# Patient Record
Sex: Female | Born: 1985 | Hispanic: No | Marital: Single | Smoking: Former smoker
Health system: Southern US, Community
[De-identification: ages and names within clinical notes are randomized; demographics above are authoritative.]

## PROBLEM LIST (undated history)

## (undated) DIAGNOSIS — D649 Anemia, unspecified: Secondary | ICD-10-CM

## (undated) DIAGNOSIS — K2970 Gastritis, unspecified, without bleeding: Secondary | ICD-10-CM

## (undated) DIAGNOSIS — M778 Other enthesopathies, not elsewhere classified: Secondary | ICD-10-CM

## (undated) DIAGNOSIS — M7582 Other shoulder lesions, left shoulder: Secondary | ICD-10-CM

## (undated) DIAGNOSIS — M7581 Other shoulder lesions, right shoulder: Secondary | ICD-10-CM

---
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
## 2006-12-07 ENCOUNTER — Emergency Department (HOSPITAL_COMMUNITY): Admission: EM | Admit: 2006-12-07 | Discharge: 2006-12-07 | Payer: Self-pay | Admitting: Family Medicine

## 2007-04-19 ENCOUNTER — Emergency Department (HOSPITAL_COMMUNITY): Admission: EM | Admit: 2007-04-19 | Discharge: 2007-04-19 | Payer: Self-pay | Admitting: Family Medicine

## 2008-11-10 ENCOUNTER — Emergency Department (HOSPITAL_COMMUNITY): Admission: EM | Admit: 2008-11-10 | Discharge: 2008-11-10 | Payer: Self-pay | Admitting: Emergency Medicine

## 2009-04-17 IMAGING — CR DG ABDOMEN 2V
2 series · 2 of 2 positions shown · non-contrast
Comparison: None.

CLINICAL DATA: Severe right abdominal pain.

ABDOMEN - 2 VIEW

[w abdomen upright]
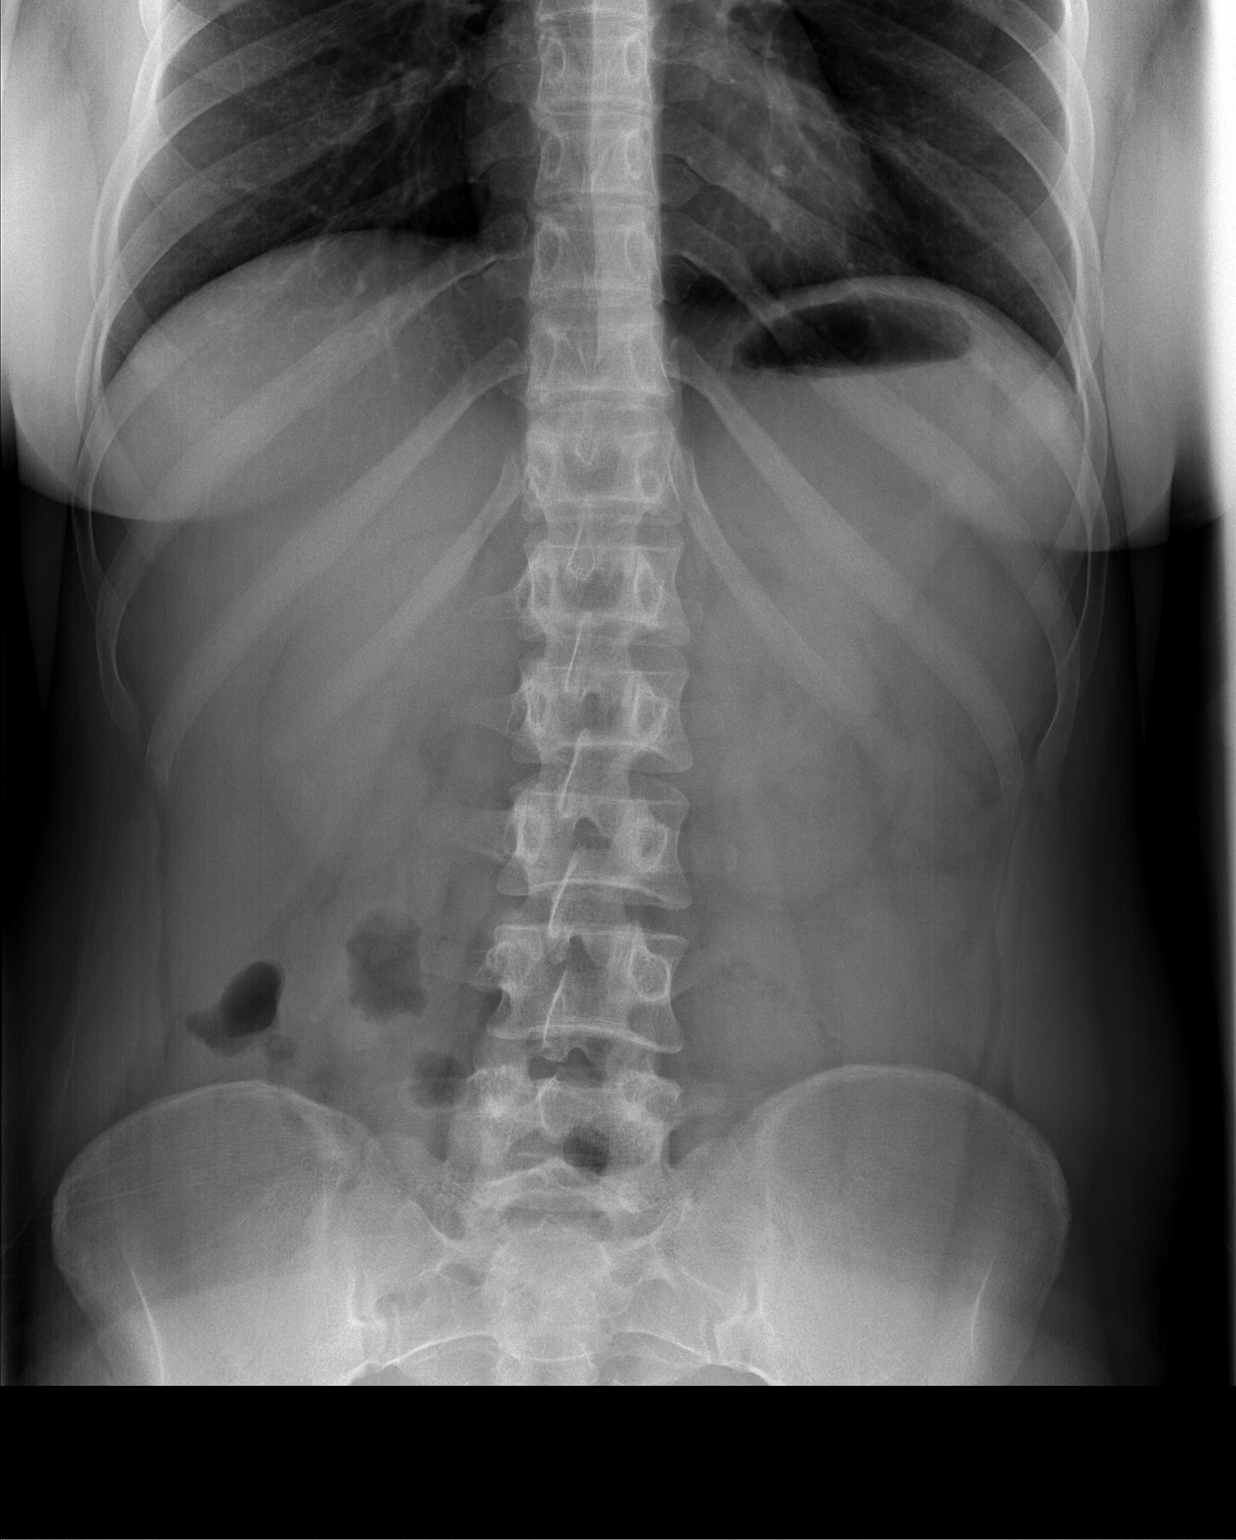

[t abdomen supine]
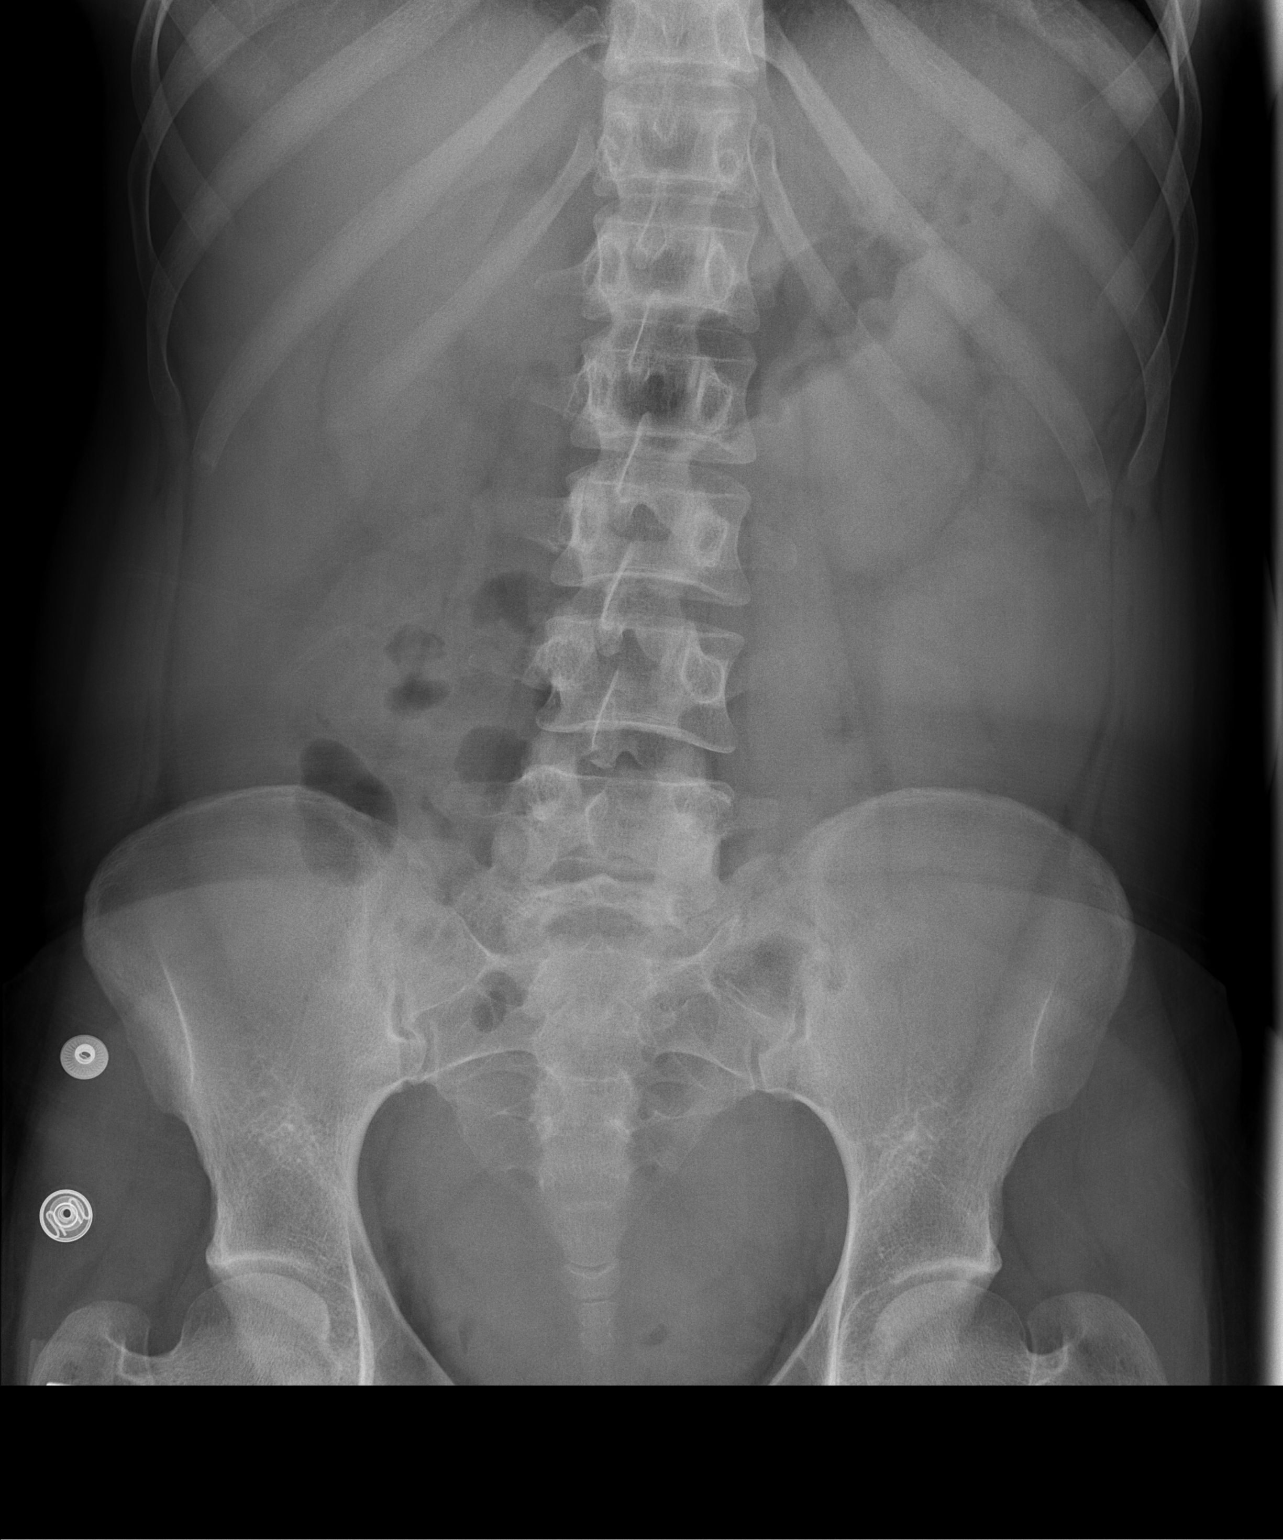

[2 of 2 positions shown; findings below may reference images not displayed]

FINDINGS: Normal bowel gas pattern without free peritoneal air.
Minimal scoliosis.
IMPRESSION: No acute abnormality.

## 2011-09-27 LAB — COMPREHENSIVE METABOLIC PANEL
AST: 24
Alkaline Phosphatase: 39
CO2: 18 — ABNORMAL LOW
Calcium: 8.3 — ABNORMAL LOW
Creatinine, Ser: 1.01
Glucose, Bld: 101 — ABNORMAL HIGH
Sodium: 137
Total Bilirubin: 0.8

## 2011-09-27 LAB — DIFFERENTIAL
Basophils Absolute: 0.1
Basophils Relative: 1
Eosinophils Relative: 0
Lymphocytes Relative: 11 — ABNORMAL LOW
Monocytes Absolute: 0.6
Monocytes Relative: 5
Neutro Abs: 10.3 — ABNORMAL HIGH

## 2011-09-27 LAB — CBC
HCT: 27.1 — ABNORMAL LOW
Hemoglobin: 9.4 — ABNORMAL LOW
MCHC: 34.6
MCV: 92.3
RBC: 2.93 — ABNORMAL LOW

## 2011-09-27 LAB — URINALYSIS, ROUTINE W REFLEX MICROSCOPIC
Leukocytes, UA: NEGATIVE
Nitrite: NEGATIVE
Protein, ur: NEGATIVE
Urobilinogen, UA: 0.2

## 2011-09-27 LAB — URINE MICROSCOPIC-ADD ON

## 2012-12-24 ENCOUNTER — Emergency Department (HOSPITAL_COMMUNITY)
Admission: EM | Admit: 2012-12-24 | Discharge: 2012-12-24 | Disposition: A | Discharge status: Home / Self Care | Payer: No Typology Code available for payment source | Attending: Emergency Medicine | Admitting: Emergency Medicine

## 2012-12-24 ENCOUNTER — Encounter (HOSPITAL_COMMUNITY): Payer: Self-pay

## 2012-12-24 ENCOUNTER — Emergency Department (HOSPITAL_COMMUNITY): Payer: No Typology Code available for payment source

## 2012-12-24 DIAGNOSIS — S0181XA Laceration without foreign body of other part of head, initial encounter: Secondary | ICD-10-CM

## 2012-12-24 DIAGNOSIS — IMO0002 Reserved for concepts with insufficient information to code with codable children: Secondary | ICD-10-CM

## 2012-12-24 DIAGNOSIS — Y92410 Unspecified street and highway as the place of occurrence of the external cause: Secondary | ICD-10-CM | POA: Insufficient documentation

## 2012-12-24 DIAGNOSIS — S0180XA Unspecified open wound of other part of head, initial encounter: Secondary | ICD-10-CM | POA: Insufficient documentation

## 2012-12-24 DIAGNOSIS — S0083XA Contusion of other part of head, initial encounter: Secondary | ICD-10-CM

## 2012-12-24 DIAGNOSIS — S0990XA Unspecified injury of head, initial encounter: Secondary | ICD-10-CM | POA: Insufficient documentation

## 2012-12-24 DIAGNOSIS — Y9389 Activity, other specified: Secondary | ICD-10-CM | POA: Insufficient documentation

## 2012-12-24 DIAGNOSIS — S1093XA Contusion of unspecified part of neck, initial encounter: Secondary | ICD-10-CM | POA: Insufficient documentation

## 2012-12-24 DIAGNOSIS — S0003XA Contusion of scalp, initial encounter: Secondary | ICD-10-CM | POA: Insufficient documentation

## 2012-12-24 MED ORDER — HALOPERIDOL LACTATE 5 MG/ML IJ SOLN
INTRAMUSCULAR | Status: AC
  Administered 2012-12-24: 5 mg via INTRAMUSCULAR
  Filled 2012-12-24: qty 1

## 2012-12-24 MED ORDER — HALOPERIDOL LACTATE 5 MG/ML IJ SOLN
5.0000 mg | Freq: Once | INTRAMUSCULAR | Status: AC
  Administered 2012-12-24: 5 mg via INTRAMUSCULAR

## 2012-12-24 NOTE — ED Notes (Signed)
Pt became very aggressive and uncooperative upon return to ED room. Freida Busman and [place], EDPs at bedside. Explained to pt that she was under IVC at this time for not cooperating. Pt cursing staff and refusing any explanations from this RN. Pt constantly taking BP cuff off after being instructed multiple times not too. Plan to perform head CT scan.

## 2012-12-24 NOTE — ED Provider Notes (Signed)
Patient signed out to me by Dr. Norlene Campbell. Patient was not cooperative and required Haldol for sedation. Her head CT was negative. She is neurologically stable at this time. Laceration was repaired by the physician assistant. Her father is here to take her home and  Toy Baker, MD 12/24/12 1005
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
## 2012-12-24 NOTE — ED Notes (Signed)
Pt arrived.  Not cooperating.  Refusing all care.  Dr. Norlene Campbell at bedside
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
## 2012-12-24 NOTE — ED Notes (Signed)
Dr. Norlene Campbell at bedside speaking to patient.  Dr. Norlene Campbell requesting CT scan and pt refusing.  Dr Norlene Campbell also notified patient of need for stitches to left eye laceration.  Wound cleaned with wash cloth by patient.  Pt refusing assist to clean wound.  Pressure held to stop bleeding by patient.  Bleeding controlled.

## 2012-12-24 NOTE — ED Notes (Signed)
IVC papers delivered to ED by [employer]. Freida Busman, EDP at bedside.

## 2012-12-24 NOTE — ED Notes (Signed)
Pt's mother and father arrived to ED. Pt given clothing back to change into. Leonel Ramsay and MD student made aware of their arrival. States he will rescinde pt's IVC paperwork and d/c.
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
## 2012-12-24 NOTE — ED Notes (Signed)
Writer asked pt for a set of VS, pt declined everything, no VS or urine sample from staff.
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
## 2012-12-24 NOTE — ED Provider Notes (Signed)
LACERATION REPAIR Performed by: Glade Nurse Authorized by: Glade Nurse Consent: Verbal consent obtained. Risks and benefits: risks, benefits and alternatives were discussed Consent given by: patient Patient identity confirmed: provided demographic data Prepped and Draped in normal sterile fashion Wound explored  Laceration Location: just below left lateral eyebrow  Laceration Length: 2.5 cm  No Foreign Bodies seen or palpated  Anesthesia: local infiltration  Local anesthetic: lidocaine 2%  With epinephrine  Anesthetic total: 2 ml  Irrigation method: syringe Amount of cleaning: standard  Skin closure: prolene 5-0  Number of sutures: 4  Technique: interrupted  Patient tolerance: Patient tolerated the procedure well with no immediate complications.  Glade Nurse, PA-C 12/24/12 1423

## 2012-12-24 NOTE — ED Notes (Signed)
Pt attempting to leave.  Pt deemed unsafe to leave by self.  Pt instructed that is she will call someone to take responsibility for her, she can leave.  Pt given access to phone.  Refusing to give number to staff,.  Numbers that were given do not work.  [employer] remains with staff.

## 2012-12-25 NOTE — ED Provider Notes (Signed)
Medical screening examination/treatment/procedure(s) were conducted as a shared visit with non-physician practitioner(s) and myself.  I personally evaluated the patient during the encounter  Toy Baker, MD 12/25/12 [phone]

## 2013-05-31 IMAGING — CT CT HEAD W/O CM
2 series · 17 of 30 positions shown, 20 images · non-contrast
Comparison: None

CLINICAL DATA: Pain.  Fall.  Hit head.

CT HEAD WITHOUT CONTRAST
TECHNIQUE: Contiguous axial images were obtained from the base of
the skull through the vertex without contrast.

[Series 2: head w/o · axial · non-contrast · 0.43mm/px · z∈[-138,-24]mm · 9 of 29 slices shown, 12 images]
[im 3/29  brain]
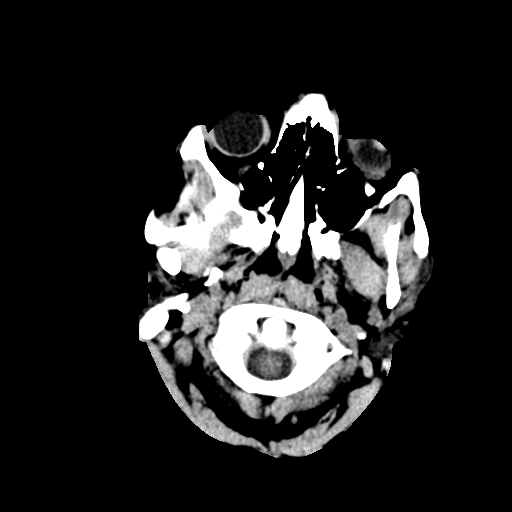
[im 3/29  bone]
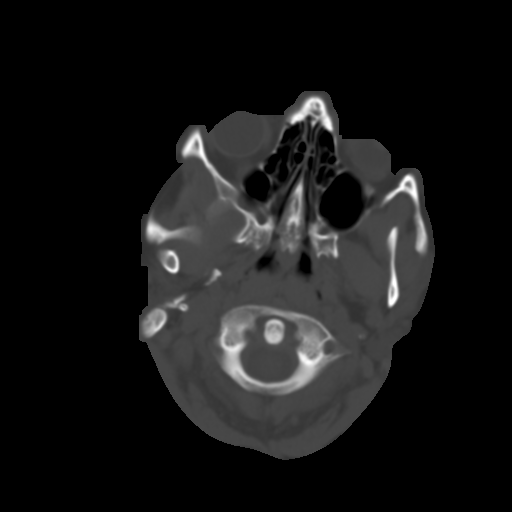
[im 6/29  brain]
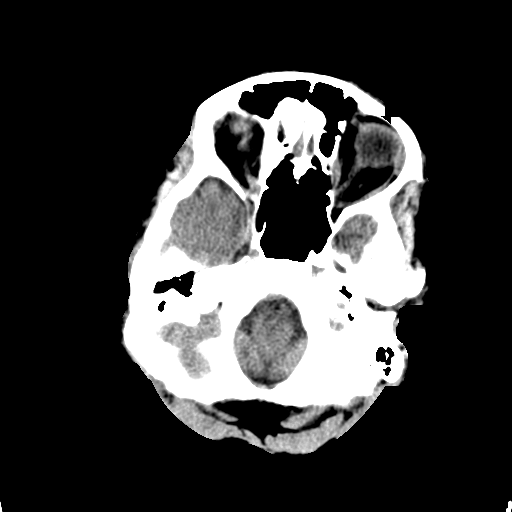
[im 9/29  brain]
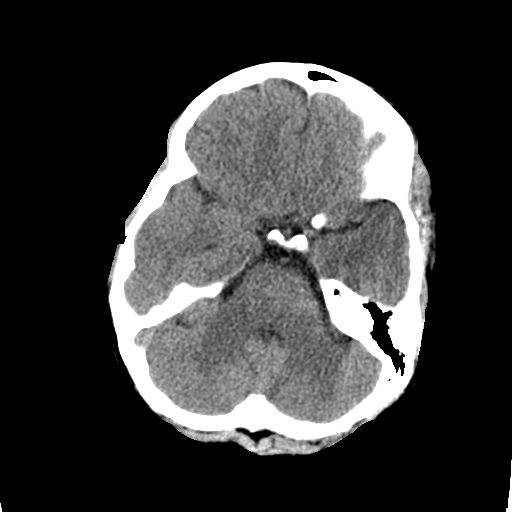
[im 12/29  brain]
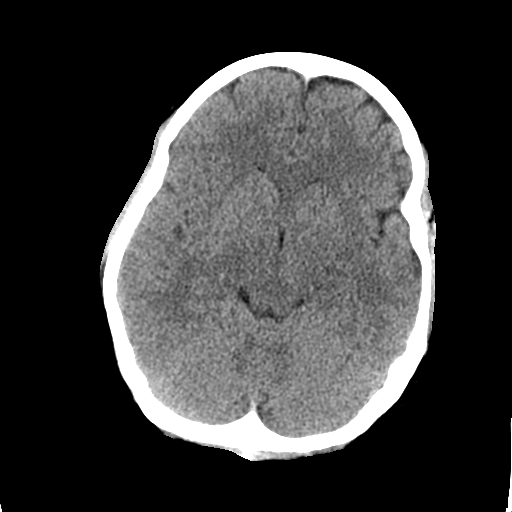
[im 15/29  brain]
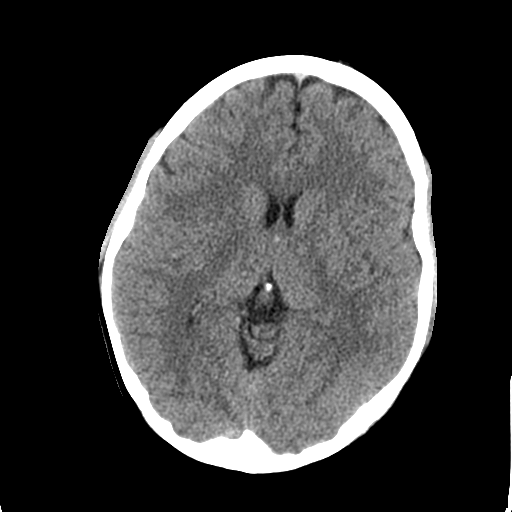
[im 15/29  bone]
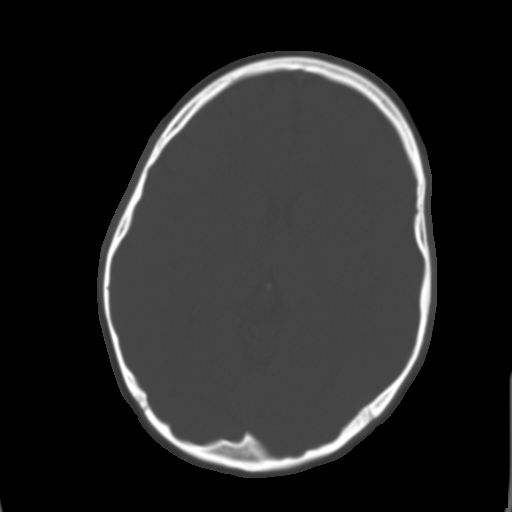
[im 17/29  brain]
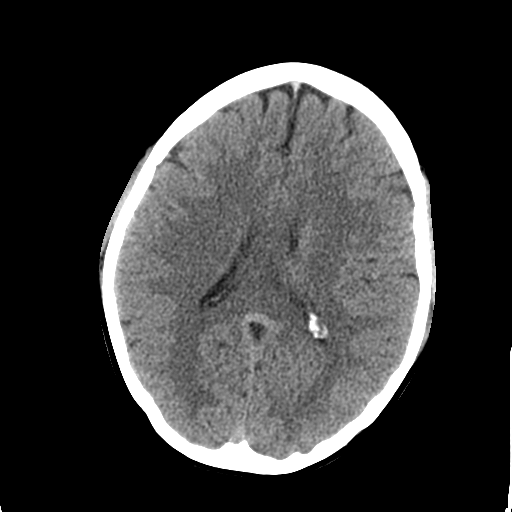
[im 20/29  brain]
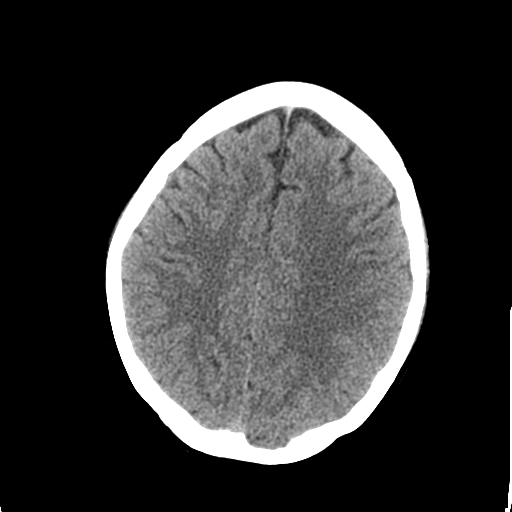
[im 23/29  brain]
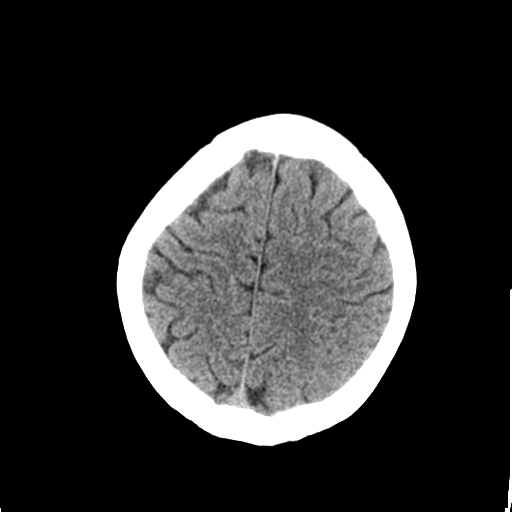
[im 26/29  brain]
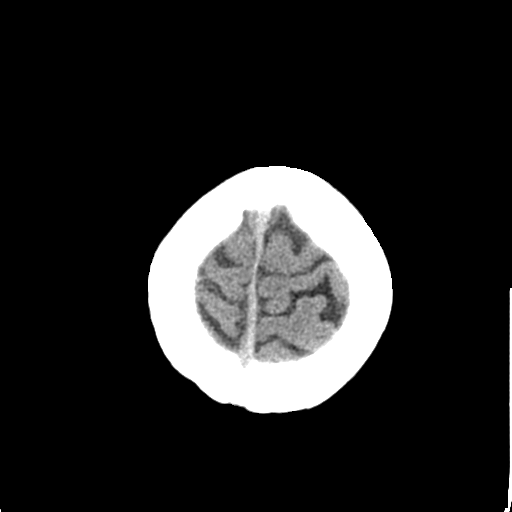
[im 26/29  bone]
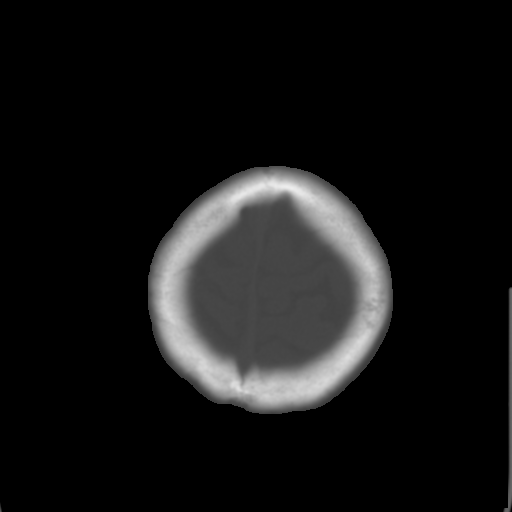

[Series 3: bone windows · axial · 0.43mm/px · z∈[-134,-26]mm · 8 of 48 slices shown]
[im 6/48  bone]
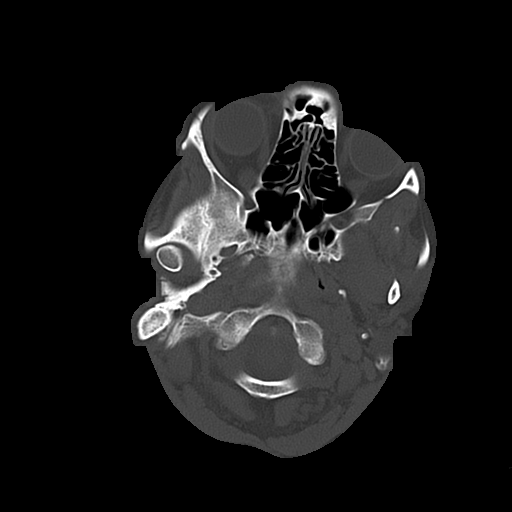
[im 11/48  bone]
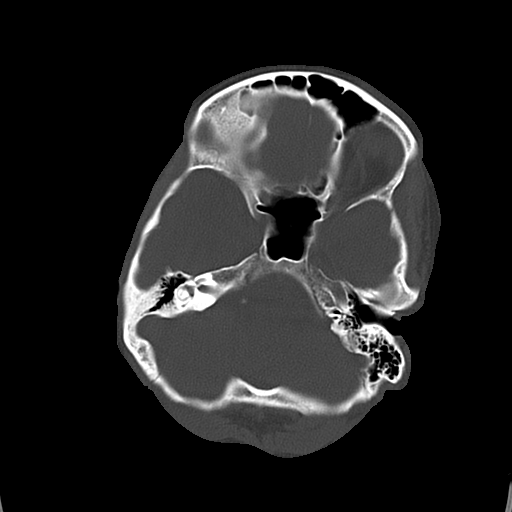
[im 16/48  bone]
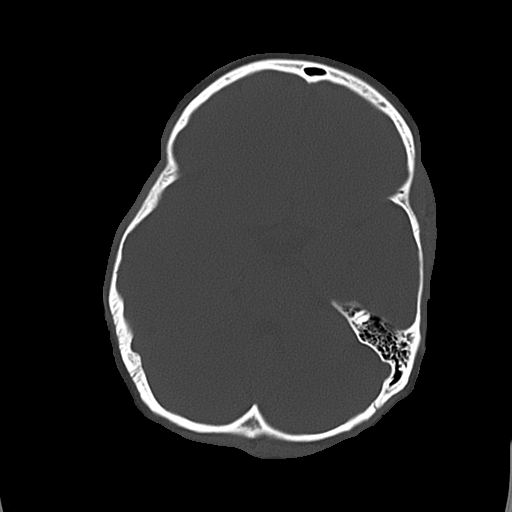
[im 21/48  bone]
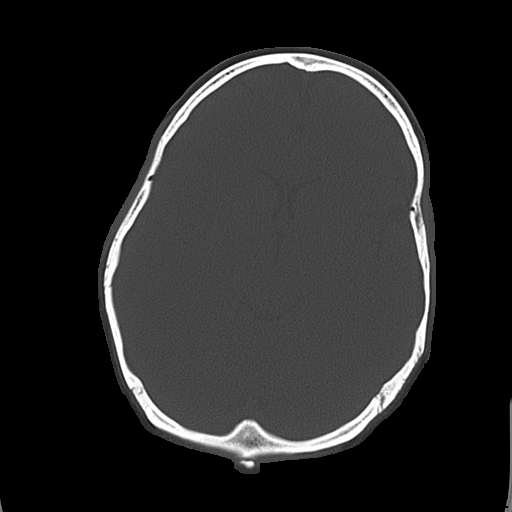
[im 27/48  bone]
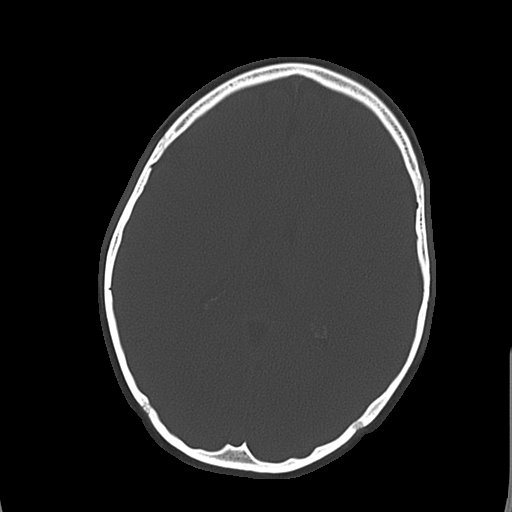
[im 32/48  bone]
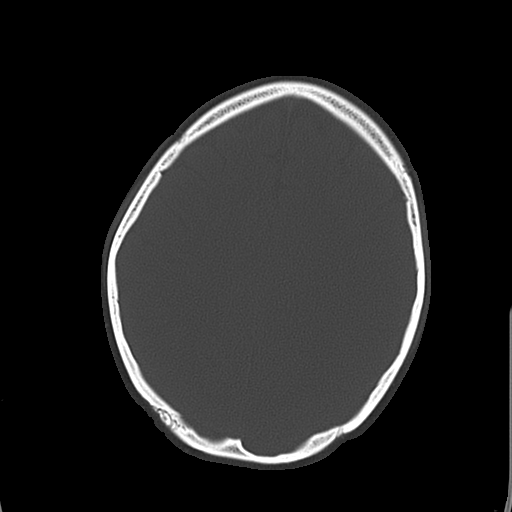
[im 37/48  bone]
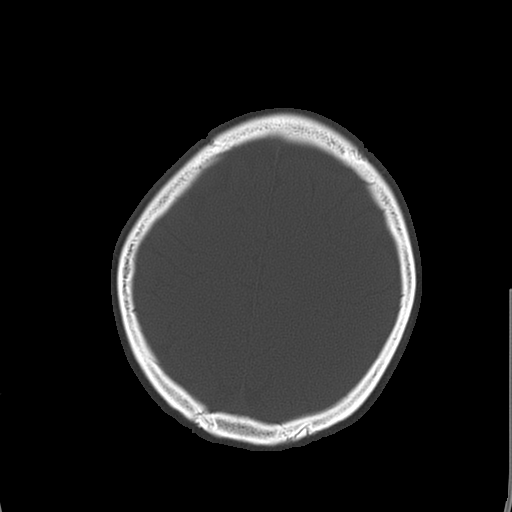
[im 42/48  bone]
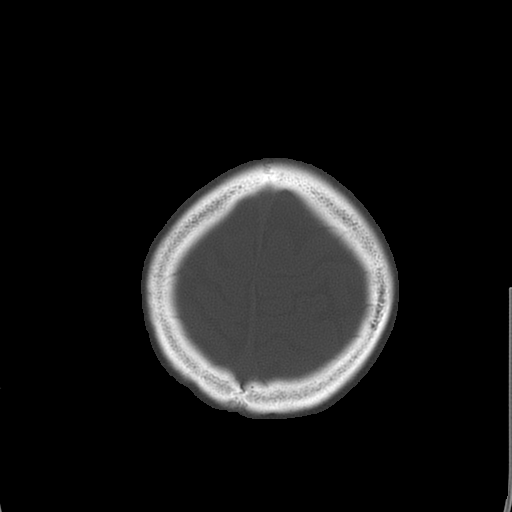

[17 of 30 positions shown; findings below may reference images not displayed]

FINDINGS: No acute intracranial abnormality.  Specifically, no
hemorrhage, hydrocephalus, mass lesion, acute infarction, or
significant intracranial injury.  No acute calvarial abnormality.
Visualized paranasal sinuses and mastoids clear.  Orbital soft
tissues unremarkable.
IMPRESSION: No intracranial abnormality.

## 2014-07-10 ENCOUNTER — Encounter (HOSPITAL_COMMUNITY): Payer: Self-pay | Admitting: Emergency Medicine

## 2014-07-10 ENCOUNTER — Emergency Department (INDEPENDENT_AMBULATORY_CARE_PROVIDER_SITE_OTHER)
Admission: EM | Admit: 2014-07-10 | Discharge: 2014-07-10 | Disposition: A | Discharge status: Home / Self Care | Payer: Self-pay | Source: Home / Self Care | Attending: Emergency Medicine | Admitting: Emergency Medicine

## 2014-07-10 DIAGNOSIS — M7582 Other shoulder lesions, left shoulder: Principal | ICD-10-CM

## 2014-07-10 DIAGNOSIS — M719 Bursopathy, unspecified: Secondary | ICD-10-CM

## 2014-07-10 DIAGNOSIS — M7581 Other shoulder lesions, right shoulder: Secondary | ICD-10-CM

## 2014-07-10 DIAGNOSIS — M67919 Unspecified disorder of synovium and tendon, unspecified shoulder: Secondary | ICD-10-CM

## 2014-07-10 MED ORDER — METHYLPREDNISOLONE ACETATE 40 MG/ML IJ SUSP
INTRAMUSCULAR | Status: AC
  Filled 2014-07-10: qty 5

## 2014-07-10 MED ORDER — HYDROCODONE-ACETAMINOPHEN 5-325 MG PO TABS: ORAL_TABLET | ORAL | Status: DC

## 2014-07-10 MED ORDER — METHYLPREDNISOLONE ACETATE PF 40 MG/ML IJ SUSP
40.0000 mg | Freq: Once | INTRAMUSCULAR | Status: AC
  Administered 2014-07-10: 40 mg via INTRA_ARTICULAR

## 2014-07-10 MED ORDER — MELOXICAM 15 MG PO TABS: 15.0000 mg | ORAL_TABLET | Freq: Every day | ORAL | Status: DC

## 2014-07-10 NOTE — ED Notes (Signed)
Ice pack x 2 to shoulders

## 2014-07-10 NOTE — ED Notes (Signed)
Reports bilateral shoulder pain sinc January 2015. No known injury, no relie w OTC pain medications. NAD

## 2014-07-10 NOTE — ED Provider Notes (Signed)
Chief Complaint   Chief Complaint  Patient presents with  . Shoulder Pain    History of Present Illness   Kathleen Spencer is a 28 year old female who's had a seven-month history of pain in both shoulders. She denies any injury. She works as a Naval architect. She does not do a lot of heavy lifting. The pain seems to alternate between one shoulder and the other. It hurts with any type of movements such as abduction or flexion. She denies any neck pain, chest pain, or upper back pain. There is no shortness of breath. The pain does not radiate down the arms there is no numbness, tingling, swelling, or weakness.  Review of Systems   Other than as noted above, the patient denies any of the following symptoms: Systemic:  No fevers or chills. Musculoskeletal:  No joint pain, arthritis, swelling, back pain, or neck pain. No history of arthritis.  Neurological:  No muscular weakness or paresthesia.  PMFSH   Past medical history, family history, social history, meds, and allergies were reviewed.    Physical Examination     Vital signs:  BP 100/74  Pulse 76  Temp(Src) 98.4 F (36.9 C) (Oral)  Resp 16  SpO2 99% Gen:  Alert and oriented times 3.  In no distress. Musculoskeletal: She has pain to palpation over both shoulders laterally and superiorly. Shoulders have a limited range of motion with pain. Neer test was positive.  Hawkins test was positive.  Empty cans test was positive. Speed's test was positive.  Yergason sign was negative.  Otherwise, all joints had a full a ROM with no swelling, bruising or deformity.  No edema, pulses full. Extremities were warm and pink.  Capillary refill was brisk.  Skin:  Clear, warm and dry.  No rash. Neuro:  Alert and oriented times 3.  Muscle strength was normal.  Sensation was intact to light touch.   Procedure Note:  Verbal informed consent was obtained from the patient.  Risks and benefits were outlined with the patient.  Patient understands and  accepts these risks. A time out was called and the name of the procedure, the procedure site, and identity of the patient were confirmed verbally and by wristband.    The procedure was then performed as follows:  Posterior aspect of the left shoulder was prepped with Betadine and alcohol and anesthetized with ethyl chloride spray. Using a one half inch 27-gauge needle, 2 cc of 2% Xylocaine and 1 cc of Depo-Medrol 40 mg strength were injected into the subacromial space. The patient tolerated this procedure well. A Band-Aid was applied.  The patient tolerated the procedure well without any immediate complications.     Assessment   The encounter diagnosis was Tendonitis of both rotator cuffs.  Plan     1.  Meds:  The following meds were prescribed:   Discharge Medication List as of 07/10/2014  5:45 PM    START taking these medications   Details  HYDROcodone-acetaminophen (NORCO/VICODIN) 5-325 MG per tablet 1 to 2 tabs every 4 to 6 hours as needed for pain., Print    meloxicam (MOBIC) 15 MG tablet Take 1 tablet (15 mg total) by mouth daily., Starting 07/10/2014, Until Discontinued, Normal        2.  Patient Education/Counseling:  The patient was given appropriate handouts, self care instructions, and instructed in symptomatic relief.  Suggested resting, icing for the first 3 days, followed by exercises. It's the other shoulder is still symptomatic, may return again in  about a week for injection of the other shoulder.  3.  Follow up:  The patient was told to follow up here if no better in 3 to 4 days, or sooner if becoming worse in any way, and given some red flag symptoms such as worsening pain or new neurological symptoms which would prompt immediate return.  Followup with orthopedics if no better in one to 2 weeks.     Reuben Likesavid C Suzannah Bettes, MD 07/10/14 2127

## 2014-07-10 NOTE — Discharge Instructions (Signed)

## 2014-07-17 ENCOUNTER — Emergency Department (INDEPENDENT_AMBULATORY_CARE_PROVIDER_SITE_OTHER)
Admission: EM | Admit: 2014-07-17 | Discharge: 2014-07-17 | Disposition: A | Discharge status: Home / Self Care | Payer: Self-pay | Source: Home / Self Care | Attending: Family Medicine | Admitting: Family Medicine

## 2014-07-17 ENCOUNTER — Encounter (HOSPITAL_COMMUNITY): Payer: Self-pay | Admitting: Emergency Medicine

## 2014-07-17 DIAGNOSIS — IMO0002 Reserved for concepts with insufficient information to code with codable children: Secondary | ICD-10-CM

## 2014-07-17 DIAGNOSIS — M75100 Unspecified rotator cuff tear or rupture of unspecified shoulder, not specified as traumatic: Secondary | ICD-10-CM

## 2014-07-17 DIAGNOSIS — M7551 Bursitis of right shoulder: Secondary | ICD-10-CM

## 2014-07-17 MED ORDER — METHYLPREDNISOLONE ACETATE 40 MG/ML IJ SUSP
40.0000 mg | Freq: Once | INTRAMUSCULAR | Status: AC
  Administered 2014-07-17: 40 mg via INTRA_ARTICULAR

## 2014-07-17 MED ORDER — METHYLPREDNISOLONE ACETATE 40 MG/ML IJ SUSP
INTRAMUSCULAR | Status: AC
  Filled 2014-07-17: qty 5

## 2014-07-17 NOTE — ED Provider Notes (Signed)
Kathleen CanalesHaley N Spencer is a 28 y.o. female who presents to Urgent Care today for right shoulder pain. Patient was seen on July 17 for bilateral shoulder pain found to be subacromial bursitis bilaterally. She received a left-sided corticosteroid injection and felt much better. She's here today for right-sided corticosteroid injection. She has pain is worse with overhand motion reaching back and at night. Pain is been present for months. Over-the-counter and oral medications have not been very effective. She denies any neck pain radiating pain weakness or numbness.   History reviewed. No pertinent past medical history. History  Substance Use Topics  . Smoking status: Current Every Day Smoker  . Smokeless tobacco: Not on file  . Alcohol Use: Yes   ROS as above Medications: No current facility-administered medications for this encounter.   Current Outpatient Prescriptions  Medication Sig Dispense Refill  . HYDROcodone-acetaminophen (NORCO/VICODIN) 5-325 MG per tablet 1 to 2 tabs every 4 to 6 hours as needed for pain.  20 tablet  0  . meloxicam (MOBIC) 15 MG tablet Take 1 tablet (15 mg total) by mouth daily.  30 tablet  0    Exam:  BP 103/76  Pulse 104  Temp(Src) 98.1 F (36.7 C) (Oral)  Resp 16  Ht 5\' 4"  (1.626 m)  Wt 125 lb (56.7 kg)  BMI 21.45 kg/m2  SpO2 97% Gen: Well NAD Right shoulder: Normal-appearing nontender Range of motion limited in abduction by pain. Positive impingement testing strength is intact throughout Capillary refill, pulses, and sensation intact distally  Subacromial Injection: Right  Consent obtained and time out performed.  Area cleaned with alcohol.  40Mg  of depomedrol and 3ml of 0.5% marcaine was injected into the subacromial bursa without complication or bleeding. Patient tolerated the procedure well.     No results found for this or any previous visit (from the past 24 hour(s)). No results found.  Assessment and Plan: 28 y.o. female with right-sided  subacromial bursitis. Plan for home exercise program and corticosteroid injection. Meloxicam as needed.  Discussed warning signs or symptoms. Please see discharge instructions. Patient expresses understanding.   This note was created using Conservation officer, historic buildingsDragon voice recognition software. Any transcription errors are unintended.    Rodolph BongEvan S Lama Narayanan, MD 07/17/14 [phone]

## 2014-07-17 NOTE — Discharge Instructions (Signed)
Thank you for coming in today. 'Do the exercises we talked about.  Call or go to the ER if you develop a large red swollen joint with extreme pain or oozing puss.   Impingement Syndrome, Rotator Cuff, Bursitis with Rehab Impingement syndrome is a condition that involves inflammation of the tendons of the rotator cuff and the subacromial bursa, that causes pain in the shoulder. The rotator cuff consists of four tendons and muscles that control much of the shoulder and upper arm function. The subacromial bursa is a fluid filled sac that helps reduce friction between the rotator cuff and one of the bones of the shoulder (acromion). Impingement syndrome is usually an overuse injury that causes swelling of the bursa (bursitis), swelling of the tendon (tendonitis), and/or a tear of the tendon (strain). Strains are classified into three categories. Grade 1 strains cause pain, but the tendon is not lengthened. Grade 2 strains include a lengthened ligament, due to the ligament being stretched or partially ruptured. With grade 2 strains there is still function, although the function may be decreased. Grade 3 strains include a complete tear of the tendon or muscle, and function is usually impaired. SYMPTOMS   Pain around the shoulder, often at the outer portion of the upper arm.  Pain that gets worse with shoulder function, especially when reaching overhead or lifting.  Sometimes, aching when not using the arm.  Pain that wakes you up at night.  Sometimes, tenderness, swelling, warmth, or redness over the affected area.  Loss of strength.  Limited motion of the shoulder, especially reaching behind the back (to the back pocket or to unhook bra) or across your body.  Crackling sound (crepitation) when moving the arm.  Biceps tendon pain and inflammation (in the front of the shoulder). Worse when bending the elbow or lifting. CAUSES  Impingement syndrome is often an overuse injury, in which chronic  (repetitive) motions cause the tendons or bursa to become inflamed. A strain occurs when a force is paced on the tendon or muscle that is greater than it can withstand. Common mechanisms of injury include: Stress from sudden increase in duration, frequency, or intensity of training.  Direct hit (trauma) to the shoulder.  Aging, erosion of the tendon with normal use.  Bony bump on shoulder (acromial spur). RISK INCREASES WITH:  Contact sports (football, wrestling, boxing).  Throwing sports (baseball, tennis, volleyball).  Weightlifting and bodybuilding.  Heavy labor.  Previous injury to the rotator cuff, including impingement.  Poor shoulder strength and flexibility.  Failure to warm up properly before activity.  Inadequate protective equipment.  Old age.  Bony bump on shoulder (acromial spur). PREVENTION   Warm up and stretch properly before activity.  Allow for adequate recovery between workouts.  Maintain physical fitness:  Strength, flexibility, and endurance.  Cardiovascular fitness.  Learn and use proper exercise technique. PROGNOSIS  If treated properly, impingement syndrome usually goes away within 6 weeks. Sometimes surgery is required.  RELATED COMPLICATIONS   Longer healing time if not properly treated, or if not given enough time to heal.  Recurring symptoms, that result in a chronic condition.  Shoulder stiffness, frozen shoulder, or loss of motion.  Rotator cuff tendon tear.  Recurring symptoms, especially if activity is resumed too soon, with overuse, with a direct blow, or when using poor technique. TREATMENT  Treatment first involves the use of ice and medicine, to reduce pain and inflammation. The use of strengthening and stretching exercises may help reduce pain with activity. These  exercises may be performed at home or with a therapist. If non-surgical treatment is unsuccessful after more than 6 months, surgery may be advised. After surgery  and rehabilitation, activity is usually possible in 3 months.  MEDICATION  If pain medicine is needed, nonsteroidal anti-inflammatory medicines (aspirin and ibuprofen), or other minor pain relievers (acetaminophen), are often advised.  Do not take pain medicine for 7 days before surgery.  Prescription pain relievers may be given, if your caregiver thinks they are needed. Use only as directed and only as much as you need.  Corticosteroid injections may be given by your caregiver. These injections should be reserved for the most serious cases, because they may only be given a certain number of times. HEAT AND COLD  Cold treatment (icing) should be applied for 10 to 15 minutes every 2 to 3 hours for inflammation and pain, and immediately after activity that aggravates your symptoms. Use ice packs or an ice massage.  Heat treatment may be used before performing stretching and strengthening activities prescribed by your caregiver, physical therapist, or athletic trainer. Use a heat pack or a warm water soak. SEEK MEDICAL CARE IF:   Symptoms get worse or do not improve in 4 to 6 weeks, despite treatment.  New, unexplained symptoms develop. (Drugs used in treatment may produce side effects.) EXERCISES  RANGE OF MOTION (ROM) AND STRETCHING EXERCISES - Impingement Syndrome (Rotator Cuff  Tendinitis, Bursitis) These exercises may help you when beginning to rehabilitate your injury. Your symptoms may go away with or without further involvement from your physician, physical therapist or athletic trainer. While completing these exercises, remember:   Restoring tissue flexibility helps normal motion to return to the joints. This allows healthier, less painful movement and activity.  An effective stretch should be held for at least 30 seconds.  A stretch should never be painful. You should only feel a gentle lengthening or release in the stretched tissue. STRETCH - Flexion, Standing  Stand with good  posture. With an underhand grip on your right / left hand, and an overhand grip on the opposite hand, grasp a broomstick or cane so that your hands are a little more than shoulder width apart.  Keeping your right / left elbow straight and shoulder muscles relaxed, push the stick with your opposite hand, to raise your right / left arm in front of your body and then overhead. Raise your arm until you feel a stretch in your right / left shoulder, but before you have increased shoulder pain.  Try to avoid shrugging your right / left shoulder as your arm rises, by keeping your shoulder blade tucked down and toward your mid-back spine. Hold for __________ seconds.  Slowly return to the starting position. Repeat __________ times. Complete this exercise __________ times per day. STRETCH - Abduction, Supine  Lie on your back. With an underhand grip on your right / left hand and an overhand grip on the opposite hand, grasp a broomstick or cane so that your hands are a little more than shoulder width apart.  Keeping your right / left elbow straight and your shoulder muscles relaxed, push the stick with your opposite hand, to raise your right / left arm out to the side of your body and then overhead. Raise your arm until you feel a stretch in your right / left shoulder, but before you have increased shoulder pain.  Try to avoid shrugging your right / left shoulder as your arm rises, by keeping your shoulder blade tucked  down and toward your mid-back spine. Hold for __________ seconds.  Slowly return to the starting position. Repeat __________ times. Complete this exercise __________ times per day. ROM - Flexion, Active-Assisted  Lie on your back. You may bend your knees for comfort.  Grasp a broomstick or cane so your hands are about shoulder width apart. Your right / left hand should grip the end of the stick, so that your hand is positioned "thumbs-up," as if you were about to shake hands.  Using your  healthy arm to lead, raise your right / left arm overhead, until you feel a gentle stretch in your shoulder. Hold for __________ seconds.  Use the stick to assist in returning your right / left arm to its starting position. Repeat __________ times. Complete this exercise __________ times per day.  ROM - Internal Rotation, Supine   Lie on your back on a firm surface. Place your right / left elbow about 60 degrees away from your side. Elevate your elbow with a folded towel, so that the elbow and shoulder are the same height.  Using a broomstick or cane and your strong arm, pull your right / left hand toward your body until you feel a gentle stretch, but no increase in your shoulder pain. Keep your shoulder and elbow in place throughout the exercise.  Hold for __________ seconds. Slowly return to the starting position. Repeat __________ times. Complete this exercise __________ times per day. STRETCH - Internal Rotation  Place your right / left hand behind your back, palm up.  Throw a towel or belt over your opposite shoulder. Grasp the towel with your right / left hand.  While keeping an upright posture, gently pull up on the towel, until you feel a stretch in the front of your right / left shoulder.  Avoid shrugging your right / left shoulder as your arm rises, by keeping your shoulder blade tucked down and toward your mid-back spine.  Hold for __________ seconds. Release the stretch, by lowering your healthy hand. Repeat __________ times. Complete this exercise __________ times per day. ROM - Internal Rotation   Using an underhand grip, grasp a stick behind your back with both hands.  While standing upright with good posture, slide the stick up your back until you feel a mild stretch in the front of your shoulder.  Hold for __________ seconds. Slowly return to your starting position. Repeat __________ times. Complete this exercise __________ times per day.  STRETCH - Posterior Shoulder  Capsule   Stand or sit with good posture. Grasp your right / left elbow and draw it across your chest, keeping it at the same height as your shoulder.  Pull your elbow, so your upper arm comes in closer to your chest. Pull until you feel a gentle stretch in the back of your shoulder.  Hold for __________ seconds. Repeat __________ times. Complete this exercise __________ times per day. STRENGTHENING EXERCISES - Impingement Syndrome (Rotator Cuff Tendinitis, Bursitis) These exercises may help you when beginning to rehabilitate your injury. They may resolve your symptoms with or without further involvement from your physician, physical therapist or athletic trainer. While completing these exercises, remember:  Muscles can gain both the endurance and the strength needed for everyday activities through controlled exercises.  Complete these exercises as instructed by your physician, physical therapist or athletic trainer. Increase the resistance and repetitions only as guided.  You may experience muscle soreness or fatigue, but the pain or discomfort you are trying to eliminate should  never worsen during these exercises. If this pain does get worse, stop and make sure you are following the directions exactly. If the pain is still present after adjustments, discontinue the exercise until you can discuss the trouble with your clinician.  During your recovery, avoid activity or exercises which involve actions that place your injured hand or elbow above your head or behind your back or head. These positions stress the tissues which you are trying to heal. STRENGTH - Scapular Depression and Adduction   With good posture, sit on a firm chair. Support your arms in front of you, with pillows, arm rests, or on a table top. Have your elbows in line with the sides of your body.  Gently draw your shoulder blades down and toward your mid-back spine. Gradually increase the tension, without tensing the muscles  along the top of your shoulders and the back of your neck.  Hold for __________ seconds. Slowly release the tension and relax your muscles completely before starting the next repetition.  After you have practiced this exercise, remove the arm support and complete the exercise in standing as well as sitting position. Repeat __________ times. Complete this exercise __________ times per day.  STRENGTH - Shoulder Abductors, Isometric  With good posture, stand or sit about 4-6 inches from a wall, with your right / left side facing the wall.  Bend your right / left elbow. Gently press your right / left elbow into the wall. Increase the pressure gradually, until you are pressing as hard as you can, without shrugging your shoulder or increasing any shoulder discomfort.  Hold for __________ seconds.  Release the tension slowly. Relax your shoulder muscles completely before you begin the next repetition. Repeat __________ times. Complete this exercise __________ times per day.  STRENGTH - External Rotators, Isometric  Keep your right / left elbow at your side and bend it 90 degrees.  Step into a door frame so that the outside of your right / left wrist can press against the door frame without your upper arm leaving your side.  Gently press your right / left wrist into the door frame, as if you were trying to swing the back of your hand away from your stomach. Gradually increase the tension, until you are pressing as hard as you can, without shrugging your shoulder or increasing any shoulder discomfort.  Hold for __________ seconds.  Release the tension slowly. Relax your shoulder muscles completely before you begin the next repetition. Repeat __________ times. Complete this exercise __________ times per day.  STRENGTH - Supraspinatus   Stand or sit with good posture. Grasp a __________ weight, or an exercise band or tubing, so that your hand is "thumbs-up," like you are shaking hands.  Slowly  lift your right / left arm in a "V" away from your thigh, diagonally into the space between your side and straight ahead. Lift your hand to shoulder height or as far as you can, without increasing any shoulder pain. At first, many people do not lift their hands above shoulder height.  Avoid shrugging your right / left shoulder as your arm rises, by keeping your shoulder blade tucked down and toward your mid-back spine.  Hold for __________ seconds. Control the descent of your hand, as you slowly return to your starting position. Repeat __________ times. Complete this exercise __________ times per day.  STRENGTH - External Rotators  Secure a rubber exercise band or tubing to a fixed object (table, pole) so that it is at  the same height as your right / left elbow when you are standing or sitting on a firm surface.  Stand or sit so that the secured exercise band is at your uninjured side.  Bend your right / left elbow 90 degrees. Place a folded towel or small pillow under your right / left arm, so that your elbow is a few inches away from your side.  Keeping the tension on the exercise band, pull it away from your body, as if pivoting on your elbow. Be sure to keep your body steady, so that the movement is coming only from your rotating shoulder.  Hold for __________ seconds. Release the tension in a controlled manner, as you return to the starting position. Repeat __________ times. Complete this exercise __________ times per day.  STRENGTH - Internal Rotators   Secure a rubber exercise band or tubing to a fixed object (table, pole) so that it is at the same height as your right / left elbow when you are standing or sitting on a firm surface.  Stand or sit so that the secured exercise band is at your right / left side.  Bend your elbow 90 degrees. Place a folded towel or small pillow under your right / left arm so that your elbow is a few inches away from your side.  Keeping the tension on the  exercise band, pull it across your body, toward your stomach. Be sure to keep your body steady, so that the movement is coming only from your rotating shoulder.  Hold for __________ seconds. Release the tension in a controlled manner, as you return to the starting position. Repeat __________ times. Complete this exercise __________ times per day.  STRENGTH - Scapular Protractors, Standing   Stand arms length away from a wall. Place your hands on the wall, keeping your elbows straight.  Begin by dropping your shoulder blades down and toward your mid-back spine.  To strengthen your protractors, keep your shoulder blades down, but slide them forward on your rib cage. It will feel as if you are lifting the back of your rib cage away from the wall. This is a subtle motion and can be challenging to complete. Ask your caregiver for further instruction, if you are not sure you are doing the exercise correctly.  Hold for __________ seconds. Slowly return to the starting position, resting the muscles completely before starting the next repetition. Repeat __________ times. Complete this exercise __________ times per day. STRENGTH - Scapular Protractors, Supine  Lie on your back on a firm surface. Extend your right / left arm straight into the air while holding a __________ weight in your hand.  Keeping your head and back in place, lift your shoulder off the floor.  Hold for __________ seconds. Slowly return to the starting position, and allow your muscles to relax completely before starting the next repetition. Repeat __________ times. Complete this exercise __________ times per day. STRENGTH - Scapular Protractors, Quadruped  Get onto your hands and knees, with your shoulders directly over your hands (or as close as you can be, comfortably).  Keeping your elbows locked, lift the back of your rib cage up into your shoulder blades, so your mid-back rounds out. Keep your neck muscles relaxed.  Hold  this position for __________ seconds. Slowly return to the starting position and allow your muscles to relax completely before starting the next repetition. Repeat __________ times. Complete this exercise __________ times per day.  STRENGTH - Scapular Retractors  Secure a  rubber exercise band or tubing to a fixed object (table, pole), so that it is at the height of your shoulders when you are either standing, or sitting on a firm armless chair.  With a palm down grip, grasp an end of the band in each hand. Straighten your elbows and lift your hands straight in front of you, at shoulder height. Step back, away from the secured end of the band, until it becomes tense.  Squeezing your shoulder blades together, draw your elbows back toward your sides, as you bend them. Keep your upper arms lifted away from your body throughout the exercise.  Hold for __________ seconds. Slowly ease the tension on the band, as you reverse the directions and return to the starting position. Repeat __________ times. Complete this exercise __________ times per day. STRENGTH - Shoulder Extensors   Secure a rubber exercise band or tubing to a fixed object (table, pole) so that it is at the height of your shoulders when you are either standing, or sitting on a firm armless chair.  With a thumbs-up grip, grasp an end of the band in each hand. Straighten your elbows and lift your hands straight in front of you, at shoulder height. Step back, away from the secured end of the band, until it becomes tense.  Squeezing your shoulder blades together, pull your hands down to the sides of your thighs. Do not allow your hands to go behind you.  Hold for __________ seconds. Slowly ease the tension on the band, as you reverse the directions and return to the starting position. Repeat __________ times. Complete this exercise __________ times per day.  STRENGTH - Scapular Retractors and External Rotators   Secure a rubber exercise  band or tubing to a fixed object (table, pole) so that it is at the height as your shoulders, when you are either standing, or sitting on a firm armless chair.  With a palm down grip, grasp an end of the band in each hand. Bend your elbows 90 degrees and lift your elbows to shoulder height, at your sides. Step back, away from the secured end of the band, until it becomes tense.  Squeezing your shoulder blades together, rotate your shoulders so that your upper arms and elbows remain stationary, but your fists travel upward to head height.  Hold for __________ seconds. Slowly ease the tension on the band, as you reverse the directions and return to the starting position. Repeat __________ times. Complete this exercise __________ times per day.  STRENGTH - Scapular Retractors and External Rotators, Rowing   Secure a rubber exercise band or tubing to a fixed object (table, pole) so that it is at the height of your shoulders, when you are either standing, or sitting on a firm armless chair.  With a palm down grip, grasp an end of the band in each hand. Straighten your elbows and lift your hands straight in front of you, at shoulder height. Step back, away from the secured end of the band, until it becomes tense.  Step 1: Squeeze your shoulder blades together. Bending your elbows, draw your hands to your chest, as if you are rowing a boat. At the end of this motion, your hands and elbow should be at shoulder height and your elbows should be out to your sides.  Step 2: Rotate your shoulders, to raise your hands above your head. Your forearms should be vertical and your upper arms should be horizontal.  Hold for __________ seconds. Slowly ease  the tension on the band, as you reverse the directions and return to the starting position. Repeat __________ times. Complete this exercise __________ times per day.  STRENGTH - Scapular Depressors  Find a sturdy chair without wheels, such as a dining room  chair.  Keeping your feet on the floor, and your hands on the chair arms, lift your bottom up from the seat, and lock your elbows.  Keeping your elbows straight, allow gravity to pull your body weight down. Your shoulders will rise toward your ears.  Raise your body against gravity by drawing your shoulder blades down your back, shortening the distance between your shoulders and ears. Although your feet should always maintain contact with the floor, your feet should progressively support less body weight, as you get stronger.  Hold for __________ seconds. In a controlled and slow manner, lower your body weight to begin the next repetition. Repeat __________ times. Complete this exercise __________ times per day.  Document Released: 12/11/2005 Document Revised: 03/04/2012 Document Reviewed: 03/25/2009 [facility] Patient Information 2015 [place], [place]. This information is not intended to replace advice given to you by your health care provider. Make sure you discuss any questions you have with your health care provider.

## 2014-07-17 NOTE — ED Notes (Signed)
C/o right shoulder pain States she was seen by dr Lorenz Coasterkeller the last time she was here. She received a steriod shot on left shoulder and now she would like her right shoulder injected

## 2015-10-07 ENCOUNTER — Encounter (HOSPITAL_COMMUNITY): Payer: Self-pay | Admitting: *Deleted

## 2015-10-07 ENCOUNTER — Emergency Department (INDEPENDENT_AMBULATORY_CARE_PROVIDER_SITE_OTHER)
Admission: EM | Admit: 2015-10-07 | Discharge: 2015-10-07 | Disposition: A | Discharge status: Home / Self Care | Payer: Self-pay | Source: Home / Self Care

## 2015-10-07 DIAGNOSIS — M779 Enthesopathy, unspecified: Secondary | ICD-10-CM

## 2015-10-07 DIAGNOSIS — K2970 Gastritis, unspecified, without bleeding: Secondary | ICD-10-CM

## 2015-10-07 HISTORY — DX: Other enthesopathies, not elsewhere classified: M77.8

## 2015-10-07 HISTORY — DX: Other shoulder lesions, right shoulder: M75.81

## 2015-10-07 HISTORY — DX: Gastritis, unspecified, without bleeding: K29.70

## 2015-10-07 HISTORY — DX: Other shoulder lesions, left shoulder: M75.82

## 2015-10-07 MED ORDER — SUCRALFATE 1 G PO TABS: 1.0000 g | ORAL_TABLET | Freq: Three times a day (TID) | ORAL | Status: DC

## 2015-10-07 MED ORDER — ZOLPIDEM TARTRATE 5 MG PO TABS: 5.0000 mg | ORAL_TABLET | Freq: Every evening | ORAL | Status: DC | PRN

## 2015-10-07 MED ORDER — OMEPRAZOLE 20 MG PO CPDR: 20.0000 mg | DELAYED_RELEASE_CAPSULE | Freq: Every day | ORAL | Status: DC

## 2015-10-07 MED ORDER — MIDAZOLAM HCL 2 MG/2ML IJ SOLN
INTRAMUSCULAR | Status: AC
  Filled 2015-10-07: qty 4

## 2015-10-07 MED ORDER — FENTANYL CITRATE (PF) 100 MCG/2ML IJ SOLN
INTRAMUSCULAR | Status: AC
  Filled 2015-10-07: qty 4

## 2015-10-07 NOTE — ED Provider Notes (Signed)
CSN: [id number]     Arrival date & time 10/07/15  1326 History   None    Chief Complaint  Patient presents with  . Abdominal Pain   (Consider location/radiation/quality/duration/timing/severity/associated sxs/prior Treatment) HPI Comments: 29 year old Caucasian female presents to urgent care complaining of abdominal pain and right shoulder pain. Both are chronic complaints of worsened in severity over the last few weeks to months. She has been diagnosed with gastritis in the past but states that her abdomen feels slightly different than it has on previous occasions with this diagnosis. She complains of pinpoint tenderness in her upper left quadrant. She states that she's had nausea and a few episodes of vomiting this morning with streaky blood. She denies diarrhea. She also denies abdominal pain, bloating, nausea or vomiting after eating fatty foods. She gets worsening pain with spicy and acidic foods. Her right shoulder has also been bothering her worsening severity over the last month. She has been diagnosed with tendinitis in both of her shoulders and received a steroid injection in the left shoulder more than 3 months ago. 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  The history is provided by the patient.    Past Medical History  Diagnosis Date  . Gastritis   . Left shoulder tendonitis   . Right shoulder tendonitis    History reviewed. No pertinent past surgical history. History reviewed. No pertinent family history. Social History  Substance Use Topics  . Smoking status: Current Every Day Smoker  . Smokeless tobacco: None  . Alcohol Use: Yes   OB History    No data available     Review of Systems  Constitutional: Negative for fever.  Respiratory: Negative for cough and shortness of breath.   Cardiovascular: Negative for chest pain.  Gastrointestinal: Positive for nausea, vomiting and abdominal  pain. Negative for diarrhea and abdominal distention.  Genitourinary: Negative for dysuria.       Admits to heavy menstrual periods  Skin: Positive for pallor.  Hematological: Bruises/bleeds easily.  All other systems reviewed and are negative.   Allergies  Review of patient's allergies indicates no known allergies.  Home Medications   Prior to Admission medications   Medication Sig Start Date End Date Taking? Authorizing Provider  HYDROcodone-acetaminophen (NORCO/VICODIN) 5-325 MG per tablet 1 to 2 tabs every 4 to 6 hours as needed for pain. 07/10/14   Reuben Likesavid C Keller, MD  meloxicam (MOBIC) 15 MG tablet Take 1 tablet (15 mg total) by mouth daily. 07/10/14   Reuben Likesavid C Keller, MD  omeprazole (PRILOSEC) 20 MG capsule Take 1 capsule (20 mg total) by mouth daily. 10/07/15   Arnaldo NatalMichael S Nielle Duford, MD  sucralfate (CARAFATE) 1 G tablet Take 1 tablet (1 g total) by mouth 4 (four) times daily -  with meals and at bedtime. 10/07/15   Arnaldo NatalMichael S Adaley Kiene, MD  zolpidem (AMBIEN) 5 MG tablet Take 1 tablet (5 mg total) by mouth at bedtime as needed for sleep. 10/07/15   Arnaldo NatalMichael S Ronya Gilcrest, MD   Meds Ordered and Administered this Visit  Medications - No data to display  BP 114/78 mmHg  Pulse 86  Temp(Src) 98.1 F (36.7 C) (Oral)  Resp 16  SpO2 98%  LMP 09/19/2015 No data found.   Physical Exam  Constitutional: She is oriented to person, place, and time. She appears well-developed and well-nourished. No distress.  HENT:  Head: Normocephalic and atraumatic.  Mouth/Throat: Oropharynx  is clear and moist.  Eyes: Conjunctivae and EOM are normal. Pupils are equal, round, and reactive to light. No scleral icterus.  Neck: Normal range of motion. Neck supple. No thyromegaly present.  Cardiovascular: Normal rate, regular rhythm and normal heart sounds.  Exam reveals no gallop and no friction rub.   No murmur heard. Pulmonary/Chest: Effort normal and breath sounds normal.  Abdominal: Soft. Bowel sounds are  normal. She exhibits no distension. There is tenderness (Left upper quadrant to deep palpation).  Genitourinary:  Deferred  Musculoskeletal: Normal range of motion. She exhibits tenderness (Pain to palpation of posterior lateral shoulder). She exhibits no edema.  Lymphadenopathy:    She has no cervical adenopathy.  Neurological: She is alert and oriented to person, place, and time. No cranial nerve deficit.  Skin: Skin is warm and dry. No rash noted. No erythema. There is pallor.  Psychiatric: She has a normal mood and affect. Her behavior is normal. Judgment and thought content normal.  Nursing note and vitals reviewed.   ED Course  Procedures (including critical care time)  Labs Review Labs Reviewed - No data to display  Imaging Review No results found.   Visual Acuity Review  Right Eye Distance:   Left Eye Distance:   Bilateral Distance:    Right Eye Near:   Left Eye Near:    Bilateral Near:         MDM   1. Gastritis   2. Tendonitis    Differential diagnosis includes gastric ulcer. Unlikely referred pain from cholecystitis as Murphy's sign negative and patient denies symptoms associated with greasy or fatty foods. Recommended establishing care with primary care physician as symptoms can represent ongoing problems including anemia. Advised smoking cessation and decrease of alcohol intake. Also likely needs rehabilitation for tendonitis and/or imaging of the right shoulder to rule out muscle tear or other soft tissue damage.    Arnaldo Natal, MD 10/07/15 [phone]

## 2015-10-07 NOTE — Discharge Instructions (Signed)
Gastritis, Adult Gastritis is soreness and puffiness (inflammation) of the lining of the stomach. If you do not get help, gastritis can cause bleeding and sores (ulcers) in the stomach. HOME CARE   Only take medicine as told by your doctor.  If you were given antibiotic medicines, take them as told. Finish the medicines even if you start to feel better.  Drink enough fluids to keep your pee (urine) clear or pale yellow.  Avoid foods and drinks that make your problems worse. Foods you may want to avoid include:  Caffeine or alcohol.  Chocolate.  Mint.  Garlic and onions.  Spicy foods.  Citrus fruits, including oranges, lemons, or limes.  Food containing tomatoes, including sauce, chili, salsa, and pizza.  Fried and fatty foods.  Eat small meals throughout the day instead of large meals. GET HELP RIGHT AWAY IF:   You have black or dark red poop (stools).  You throw up (vomit) blood. It may look like coffee grounds.  You cannot keep fluids down.  Your belly (abdominal) pain gets worse.  You have a fever.  You do not feel better after 1 week.  You have any other questions or concerns. MAKE SURE YOU:   Understand these instructions.  Will watch your condition.  Will get help right away if you are not doing well or get worse.   This information is not intended to replace advice given to you by your health care provider. Make sure you discuss any questions you have with your health care provider.   Document Released: 05/29/2008 Document Revised: 03/04/2012 Document Reviewed: 01/24/2012 Elsevier Interactive Patient Education 2016 Elsevier Inc.  Tendinitis and Tenosynovitis  Tendinitis is inflammation of the tendon. Tenosynovitis is inflammation of the lining around the tendon (tendon sheath). These painful conditions often occur at once. Tendons attach muscle to bone. To move a limb, force from the muscle moves through the tendon, to the bone. These conditions often  cause increased pain when moving. Tendinitis may be caused by a small or partial tear in the tendon.  SYMPTOMS   Pain, tenderness, redness, bruising, or swelling at the injury.  Loss of normal joint movement.  Pain that gets worse with use of the muscle and joint attached to the tendon.  Weakness in the tendon, caused by calcium build up that may occur with tendinitis.  Commonly affected tendons:  Achilles tendon (calf of leg).  Rotator cuff (shoulder joint).  Patellar tendon (kneecap to shin).  Peroneal tendon (ankle).  Posterior tibial tendon (inner ankle).  Biceps tendon (in front of shoulder). CAUSES   Sudden strain on a flexed muscle, muscle overuse, sudden increase or change in activity, vigorous activity.  Result of a direct hit (less common).  Poor muscle action (biomechanics). RISK INCREASES WITH:  Injury (trauma).  Too much exercise.  Sudden change in athletic activity.  Incorrect exercise form or technique.  Poor strength and flexibility.  Not warming-up properly before activity.  Returning to activity before healing is complete. PREVENTION   Warm-up and stretch properly before activity.  Maintain physical fitness:  Joint flexibility.  Muscle strength and endurance.  Fitness that increases heart rate.  Learn and use proper exercise techniques.  Use rehabilitation exercises to strengthen weak muscles and tendons.  Ice the tendon after activity, to reduce recurring inflammation.  Wear proper fitting protective equipment for specific tendons, when indicated. PROGNOSIS  When treated properly, can be cured in 6 to 8 weeks. Recovery may take longer, depending on degree of injury.  RELATED COMPLICATIONS   Re-injury or recurring symptoms.  Permanent weakness or joint stiffness, if injury is severe and recovery is not completed.  Delayed healing, if sports are started before healing is complete.  Tearing apart (rupture) of the inflamed  tendon. Tendinitis means the tendon is injured and must recover. TREATMENT  Treatment first involves ice, medicine, and rest from aggravating activities. This reduces pain and inflammation. Modifying your activity may be considered to prevent recurring injury. A brace, elastic bandage wrap, splint, cast, or sling may be prescribed to protect the joint for a short period. After that period, strengthening and stretching exercise may help to regain strength and full range of motion. If the condition persists, despite non-surgical treatment, surgery may be recommended to remove the inflamed tendon lining. Corticosteroid injections may be given to reduce inflammation. However, these injections may weaken the tendon and increase your risk for tendon rupture. MEDICATION   If pain medicine is needed, nonsteroidal anti-inflammatory medicines (aspirin and ibuprofen), or other minor pain relievers (acetaminophen), are often recommended.  Do not take pain medicine for 7 days before surgery.  Prescription pain relievers are usually prescribed only after surgery. Use only as directed and only as much as you need.  Ointments applied to the skin may be helpful.  Corticosteroid injections may be given to reduce inflammation. However, this may increase your risk of a tendon rupture. HEAT AND COLD  Cold treatment (icing) relieves pain and reduces inflammation. Cold treatment should be applied for 10 to 15 minutes every 2 to 3 hours, and immediately after activity that aggravates your symptoms. Use ice packs or an ice massage.  Heat treatment may be used before performing stretching and strengthening activities prescribed by your caregiver, physical therapist, or athletic trainer. Use a heat pack or a warm water soak. SEEK MEDICAL CARE IF:   Symptoms get worse or do not improve, despite treatment.  Pain becomes too much to tolerate.  You develop numbness or tingling.  Toes become cold, or toenails become  blue, gray, or dark colored.  New, unexplained symptoms develop. (Drugs used in treatment may produce side effects.)   This information is not intended to replace advice given to you by your health care provider. Make sure you discuss any questions you have with your health care provider.   Document Released: 12/11/2005 Document Revised: 03/04/2012 Document Reviewed: 03/25/2009 Elsevier Interactive Patient Education [employer]2016 Elsevier Inc.

## 2015-10-07 NOTE — ED Notes (Signed)
Pt   Reports       abd     Pain      With  Some  Nausea  Vomiting    No  Diarrhea       Worse for  Several  Days       Also   Reports    Some      r  Shoulder  Pain           For  Several  Months             denys  Any  Recent  specefic  Injury

## 2022-07-17 ENCOUNTER — Ambulatory Visit (HOSPITAL_COMMUNITY)
Admission: EM | Admit: 2022-07-17 | Discharge: 2022-07-17 | Disposition: A | Discharge status: Home / Self Care | Payer: 59 | Attending: Family Medicine | Admitting: Family Medicine

## 2022-07-17 ENCOUNTER — Encounter (HOSPITAL_COMMUNITY): Payer: Self-pay

## 2022-07-17 DIAGNOSIS — K0889 Other specified disorders of teeth and supporting structures: Secondary | ICD-10-CM | POA: Diagnosis not present

## 2022-07-17 MED ORDER — AMOXICILLIN-POT CLAVULANATE 875-125 MG PO TABS: 1.0000 | ORAL_TABLET | Freq: Two times a day (BID) | ORAL | 0 refills | Status: AC

## 2022-07-17 MED ORDER — NAPROXEN 375 MG PO TABS: 375.0000 mg | ORAL_TABLET | Freq: Two times a day (BID) | ORAL | 0 refills | Status: DC | PRN

## 2022-07-17 MED ORDER — KETOROLAC TROMETHAMINE 30 MG/ML IJ SOLN
INTRAMUSCULAR | Status: AC
  Filled 2022-07-17: qty 1

## 2022-07-17 MED ORDER — KETOROLAC TROMETHAMINE 30 MG/ML IJ SOLN
30.0000 mg | Freq: Once | INTRAMUSCULAR | Status: AC
  Administered 2022-07-17: 30 mg via INTRAMUSCULAR

## 2022-07-17 NOTE — ED Provider Notes (Signed)
[facility]    CSN: [id number] Arrival date & time: 07/17/22  0846      History   Chief Complaint Chief Complaint  Patient presents with   Dental Pain    HPI Kathleen Spencer is a 36 y.o. female.    Dental Pain  Here for right lower jaw/tooth pain.  Is been bothering her about a month or more.  No fever or chills.  It does hurt worse when she chews.  She does have a dentist but they were unable to see her at present.    Past Medical History:  Diagnosis Date   Gastritis    Left shoulder tendonitis    Right shoulder tendonitis     There are no problems to display for this patient.   History reviewed. No pertinent surgical history.  OB History     Gravida  0   Para  0   Term  0   Preterm  0   AB  0   Living  0      SAB  0   IAB  0   Ectopic  0   Multiple  0   Live Births  0            Home Medications    Prior to Admission medications   Medication Sig Start Date End Date Taking? Authorizing Provider  amoxicillin-clavulanate (AUGMENTIN) 875-125 MG tablet Take 1 tablet by mouth 2 (two) times daily for 7 days. 07/17/22 07/24/22 Yes Zenia Resides, MD  naproxen (NAPROSYN) 375 MG tablet Take 1 tablet (375 mg total) by mouth every 12 (twelve) hours as needed (pain). 07/17/22  Yes Zenia Resides, MD    Family History History reviewed. No pertinent family history.  Social History Social History   Tobacco Use   Smoking status: Former    Types: Cigarettes  Building services engineer Use: Every day  Substance Use Topics   Alcohol use: Not Currently   Drug use: Never     Allergies   Patient has no known allergies.   Review of Systems Review of Systems   Physical Exam Triage Vital Signs ED Triage Vitals  Enc Vitals Group     BP 07/17/22 0951 109/78     Pulse Rate 07/17/22 0951 84     Resp 07/17/22 0951 18     Temp 07/17/22 0951 98.2 F (36.8 C)     Temp Source 07/17/22 0951 Oral     SpO2 07/17/22 0951 98 %      Weight --      Height --      Head Circumference --      Peak Flow --      Pain Score 07/17/22 0949 8     Pain Loc --      Pain Edu? --      Excl. in GC? --    No data found.  Updated Vital Signs BP 109/78 (BP Location: Right Arm)   Pulse 84   Temp 98.2 F (36.8 C) (Oral)   Resp 18   LMP 06/18/2022   SpO2 98%   Visual Acuity Right Eye Distance:   Left Eye Distance:   Bilateral Distance:    Right Eye Near:   Left Eye Near:    Bilateral Near:     Physical Exam Vitals reviewed.  Constitutional:      General: She is not in acute distress.    Appearance: She is not ill-appearing, toxic-appearing or  diaphoretic.  HENT:     Mouth/Throat:     Mouth: Mucous membranes are moist.     Pharynx: No oropharyngeal exudate or posterior oropharyngeal erythema.     Comments: There is some dental caries noted on the posterior teeth bilaterally.  There is some tenderness along the right lower dental ridge.  Mildly swollen, but no fluctuance Eyes:     Extraocular Movements: Extraocular movements intact.     Conjunctiva/sclera: Conjunctivae normal.     Pupils: Pupils are equal, round, and reactive to light.  Cardiovascular:     Rate and Rhythm: Normal rate and regular rhythm.     Heart sounds: No murmur heard. Pulmonary:     Effort: Pulmonary effort is normal. No respiratory distress.     Breath sounds: No stridor. No wheezing, rhonchi or rales.  Skin:    Coloration: Skin is not jaundiced or pale.  Neurological:     General: No focal deficit present.     Mental Status: She is alert and oriented to person, place, and time.  Psychiatric:        Behavior: Behavior normal.      UC Treatments / Results  Labs (all labs ordered are listed, but only abnormal results are displayed) Labs Reviewed - No data to display  EKG   Radiology No results found.  Procedures Procedures (including critical care time)  Medications Ordered in UC Medications  ketorolac (TORADOL) 30 MG/ML  injection 30 mg (has no administration in time range)    Initial Impression / Assessment and Plan / UC Course  I have reviewed the triage vital signs and the nursing notes.  Pertinent labs & imaging results that were available during my care of the patient were reviewed by me and considered in my medical decision making (see chart for details).     Augmentin sent to treat possible oral infection.  Toradol for pain today.  She stated that ibuprofen hurts her stomach, so naproxen was sent instead. Final Clinical Impressions(s) / UC Diagnoses   Final diagnoses:  Pain, dental     Discharge Instructions      You have been given a shot of Toradol 30 mg today.  Take amoxicillin-clavulanate 875 mg--1 tab twice daily with food for 7 days  Take naproxen 375 mg--1 tablet every 12 hours as needed for pain      ED Prescriptions     Medication Sig Dispense Auth. Provider   amoxicillin-clavulanate (AUGMENTIN) 875-125 MG tablet Take 1 tablet by mouth 2 (two) times daily for 7 days. 14 tablet Zenia Resides, MD   naproxen (NAPROSYN) 375 MG tablet Take 1 tablet (375 mg total) by mouth every 12 (twelve) hours as needed (pain). 20 tablet Livi Mcgann, Janace Aris, MD      I have reviewed the PDMP during this encounter.   Zenia Resides, MD 07/17/22 1006

## 2022-07-17 NOTE — ED Triage Notes (Signed)
Pt states she has pain in her right lower dental pain. Pt states the pain started yesterday.

## 2022-07-17 NOTE — Discharge Instructions (Addendum)
You have been given a shot of Toradol 30 mg today.  Take amoxicillin-clavulanate 875 mg--1 tab twice daily with food for 7 days  Take naproxen 375 mg--1 tablet every 12 hours as needed for pain

## 2022-09-14 ENCOUNTER — Ambulatory Visit (HOSPITAL_COMMUNITY)
Admission: EM | Admit: 2022-09-14 | Discharge: 2022-09-14 | Disposition: A | Discharge status: Home / Self Care | Payer: 59 | Attending: Physician Assistant | Admitting: Physician Assistant

## 2022-09-14 ENCOUNTER — Encounter (HOSPITAL_COMMUNITY): Payer: Self-pay

## 2022-09-14 DIAGNOSIS — H5712 Ocular pain, left eye: Secondary | ICD-10-CM

## 2022-09-14 DIAGNOSIS — S0502XA Injury of conjunctiva and corneal abrasion without foreign body, left eye, initial encounter: Secondary | ICD-10-CM | POA: Diagnosis not present

## 2022-09-14 MED ORDER — POLYMYXIN B-TRIMETHOPRIM 10000-0.1 UNIT/ML-% OP SOLN: 1.0000 [drp] | Freq: Four times a day (QID) | OPHTHALMIC | 0 refills | Status: DC

## 2022-09-14 MED ORDER — TETRACAINE HCL 0.5 % OP SOLN
OPHTHALMIC | Status: AC
  Filled 2022-09-14: qty 4

## 2022-09-14 MED ORDER — FLUORESCEIN SODIUM 1 MG OP STRP
ORAL_STRIP | OPHTHALMIC | Status: AC
  Filled 2022-09-14: qty 1

## 2022-09-14 NOTE — ED Triage Notes (Signed)
Patient states she woke up with left eye pain and swelling. States she has "cut her eye" probably at least 10 times in her life and it feels like it has in the past.

## 2022-09-14 NOTE — ED Provider Notes (Signed)
[facility]    CSN: [id number] Arrival date & time: 09/14/22  0841      History   Chief Complaint Chief Complaint  Patient presents with   Eye Problem    HPI Kathleen Spencer is a 36 y.o. female.   36 year old female presents with left eye pain.  Patient indicates that he woke up this morning with left eye pain, tenderness, burning.  Patient indicates that she has light sensitivity.  She indicates that she can see out of the left eye but that light does bother her eye and she is sensitive to it.  She indicates that she has scratched her eye several times before in the past and that this feels exactly the same.  She does not know how she scratched it, because there is no history of any trauma to the eye.  She indicates she may have gotten eyelash in the eye and then started rubbing it while she was in her sleep.  She denies any fever, chills, nausea or vomiting.   Eye Problem   Past Medical History:  Diagnosis Date   Gastritis    Left shoulder tendonitis    Right shoulder tendonitis     There are no problems to display for this patient.   History reviewed. No pertinent surgical history.  OB History     Gravida  0   Para  0   Term  0   Preterm  0   AB  0   Living  0      SAB  0   IAB  0   Ectopic  0   Multiple  0   Live Births  0            Home Medications    Prior to Admission medications   Medication Sig Start Date End Date Taking? Authorizing Provider  trimethoprim-polymyxin b (POLYTRIM) ophthalmic solution Place 1 drop into the left eye every 6 (six) hours. 09/14/22  Yes Ellsworth Lennox, PA-C  naproxen (NAPROSYN) 375 MG tablet Take 1 tablet (375 mg total) by mouth every 12 (twelve) hours as needed (pain). 07/17/22   Zenia Resides, MD    Family History No family history on file.  Social History Social History   Tobacco Use   Smoking status: Former    Types: Cigarettes  Building services engineer Use: Every day  Substance Use  Topics   Alcohol use: Not Currently   Drug use: Never     Allergies   Patient has no known allergies.   Review of Systems Review of Systems  Eyes:  Positive for pain (left eye).     Physical Exam Triage Vital Signs ED Triage Vitals  Enc Vitals Group     BP 09/14/22 0857 103/79     Pulse Rate 09/14/22 0857 76     Resp 09/14/22 0857 18     Temp 09/14/22 0857 98 F (36.7 C)     Temp Source 09/14/22 0857 Oral     SpO2 09/14/22 0857 99 %     Weight --      Height --      Head Circumference --      Peak Flow --      Pain Score 09/14/22 0856 9     Pain Loc --      Pain Edu? --      Excl. in GC? --    No data found.  Updated Vital Signs BP 103/79 (BP Location:  Right Arm)   Pulse 76   Temp 98 F (36.7 C) (Oral)   Resp 18   LMP 08/28/2022 (Approximate)   SpO2 99%   Visual Acuity Right Eye Distance: 20/20 Left Eye Distance: 20/50 Bilateral Distance:    Right Eye Near:   Left Eye Near:    Bilateral Near:     Physical Exam Constitutional:      Appearance: Normal appearance.  Eyes:      Comments: Eyes: Left eye with conjunctiva injected.  Upper and lower eyelid eversion is normal without swelling or evidence of foreign body.  EOMI, PERRLA. Eye staining: There is a corneal abrasion that is horizontal in the lower part of the mid iris and ranges from 5:00 to 7 o'clock position.  The area is a thin 1 to 2 mm horizontal line.  No evidence of foreign body.  Neurological:     Mental Status: She is alert.      UC Treatments / Results  Labs (all labs ordered are listed, but only abnormal results are displayed) Labs Reviewed - No data to display  EKG   Radiology No results found.  Procedures Procedures (including critical care time)  Medications Ordered in UC Medications - No data to display  Initial Impression / Assessment and Plan / UC Course  I have reviewed the triage vital signs and the nursing notes.  Pertinent labs & imaging results that were  available during my care of the patient were reviewed by me and considered in my medical decision making (see chart for details).       Plan: 1.  Advised use Polytrim eyedrops, 2 drops every 6 hours until the condition improves. 2.  Patient advised that if the symptoms do not improve over the next 48 to 72 hours she is to contact [facility] ophthalmology for an appointment to be evaluated. 3.  Patient advised to follow-up PCP or return to urgent care/ER if symptoms fail to improve. Final Clinical Impressions(s) / UC Diagnoses   Final diagnoses:  Pain of left eye  Abrasion of left cornea, initial encounter     Discharge Instructions      If the area fails to improve over the next 48 to 72 hours, or worsens in your opinion, then is advised that you see an ophthalmologist.  You may call [facility] ophthalmology at [phone] and let the individual know that you were seen in the is urgent care and for a corneal abrasion and that she need to be seen and treated. Advised use of Polytrim eyedrops, 2 drops in the eye 4 times a day for the next 3 to 4 days until the area clears. Advised to wear sunglasses protect against sunlight. Advised to follow-up PCP or return to urgent care as needed.     ED Prescriptions     Medication Sig Dispense Auth. Provider   trimethoprim-polymyxin b (POLYTRIM) ophthalmic solution Place 1 drop into the left eye every 6 (six) hours. 10 mL Nyoka Lint, PA-C      PDMP not reviewed this encounter.   Nyoka Lint, PA-C 09/14/22 [phone]

## 2022-09-14 NOTE — Discharge Instructions (Addendum)
If the area fails to improve over the next 48 to 72 hours, or worsens in your opinion, then is advised that you see an ophthalmologist.  You may call [facility] ophthalmology at [phone] and let the individual know that you were seen in the is urgent care and for a corneal abrasion and that she need to be seen and treated. Advised use of Polytrim eyedrops, 2 drops in the eye 4 times a day for the next 3 to 4 days until the area clears. Advised to wear sunglasses protect against sunlight. Advised to follow-up PCP or return to urgent care as needed.

## 2024-02-07 ENCOUNTER — Encounter (HOSPITAL_COMMUNITY): Payer: Self-pay

## 2024-02-07 ENCOUNTER — Ambulatory Visit (HOSPITAL_COMMUNITY)
Admission: EM | Admit: 2024-02-07 | Discharge: 2024-02-07 | Disposition: A | Discharge status: Home / Self Care | Payer: Self-pay | Attending: Emergency Medicine | Admitting: Emergency Medicine

## 2024-02-07 DIAGNOSIS — K0889 Other specified disorders of teeth and supporting structures: Secondary | ICD-10-CM

## 2024-02-07 DIAGNOSIS — K047 Periapical abscess without sinus: Secondary | ICD-10-CM

## 2024-02-07 MED ORDER — AMOXICILLIN-POT CLAVULANATE 875-125 MG PO TABS: 1.0000 | ORAL_TABLET | Freq: Two times a day (BID) | ORAL | 0 refills | Status: AC

## 2024-02-07 NOTE — ED Triage Notes (Signed)
Pt c/o rt lower dental pain for a week and swelling since this am. States taking tylenol and ibuprofen with little relief.

## 2024-02-07 NOTE — ED Provider Notes (Signed)
[facility]    CSN: [id number] Arrival date & time: 02/07/24  1933    HISTORY   Chief Complaint  Patient presents with   Dental Pain   HPI Kathleen Spencer is a pleasant, 38 y.o. female who presents to urgent care today. Patient complains of dental pain and a broken molar on her right lower side for the past week or 2, states she woke up this morning with swelling in her face in that same area.  Patient states has been taking Tylenol and ibuprofen without relief.  Patient states that she saw a dentist in 2023 regarding this tooth, states he declined to pull the tooth.  Patient states she has not scheduled an appointment for follow-up with a dentist as of yet.  Patient denies fever, body aches, chills, nausea, vomiting, diarrhea, dizziness.  The history is provided by the patient.   Past Medical History:  Diagnosis Date   Gastritis    Left shoulder tendonitis    Right shoulder tendonitis    There are no active problems to display for this patient.  History reviewed. No pertinent surgical history. OB History     Gravida  0   Para  0   Term  0   Preterm  0   AB  0   Living  0      SAB  0   IAB  0   Ectopic  0   Multiple  0   Live Births  0          Home Medications    Prior to Admission medications   Medication Sig Start Date End Date Taking? Authorizing Provider  amoxicillin-clavulanate (AUGMENTIN) 875-125 MG tablet Take 1 tablet by mouth 2 (two) times daily for 7 days. 02/07/24 02/14/24 Yes Theadora Rama Scales, PA-C    Family History History reviewed. No pertinent family history. Social History Social History   Tobacco Use   Smoking status: Former    Types: Cigarettes  Vaping Use   Vaping status: Every Day  Substance Use Topics   Alcohol use: Not Currently   Drug use: Never   Allergies   Patient has no known allergies.  Review of Systems Review of Systems Pertinent findings revealed after performing a 14 point review of  systems has been noted in the history of present illness.  Physical Exam Vital Signs BP 108/85 (BP Location: Right Arm)   Pulse 87   Temp 98.2 F (36.8 C) (Tympanic)   Resp 18   LMP 01/28/2024 (Approximate)   SpO2 100%   No data found.  Physical Exam Vitals reviewed.  Constitutional:      General: She is not in acute distress.    Appearance: She is not ill-appearing, toxic-appearing or diaphoretic.  HENT:     Mouth/Throat:     Mouth: Mucous membranes are moist.     Pharynx: No oropharyngeal exudate or posterior oropharyngeal erythema.     Comments: There is some dental caries noted on the posterior teeth bilaterally.  There is some tenderness along the right lower dental ridge.  Mildly swollen, but no fluctuance Eyes:     Extraocular Movements: Extraocular movements intact.     Conjunctiva/sclera: Conjunctivae normal.     Pupils: Pupils are equal, round, and reactive to light.  Cardiovascular:     Rate and Rhythm: Normal rate and regular rhythm.     Heart sounds: No murmur heard. Pulmonary:     Effort: Pulmonary effort is normal. No respiratory distress.  Breath sounds: No stridor. No wheezing, rhonchi or rales.  Skin:    Coloration: Skin is not jaundiced or pale.  Neurological:     General: No focal deficit present.     Mental Status: She is alert and oriented to person, place, and time.  Psychiatric:        Behavior: Behavior normal.     Visual Acuity Right Eye Distance:   Left Eye Distance:   Bilateral Distance:    Right Eye Near:   Left Eye Near:    Bilateral Near:     UC Couse / Diagnostics / Procedures:     Radiology No results found.  Procedures Procedures (including critical care time) EKG  Pending results:  Labs Reviewed - No data to display  Medications Ordered in UC: Medications - No data to display  UC Diagnoses / Final Clinical Impressions(s)   I have reviewed the triage vital signs and the nursing notes.  Pertinent labs &  imaging results that were available during my care of the patient were reviewed by me and considered in my medical decision making (see chart for details).    Final diagnoses:  Pain, dental  Dental infection   Will treat patient empirically for dental abscess as before with a 7-day course of Augmentin.  Patient encouraged to follow-up with dentist as soon as possible.  Return precautions advised.  Please see discharge instructions below for details of plan of care as provided to patient. ED Prescriptions     Medication Sig Dispense Auth. Provider   amoxicillin-clavulanate (AUGMENTIN) 875-125 MG tablet Take 1 tablet by mouth 2 (two) times daily for 7 days. 14 tablet Theadora Rama Scales, PA-C      PDMP not reviewed this encounter.  Pending results:  Labs Reviewed - No data to display  Discharge Instructions   None     Disposition Upon Discharge:  Condition: stable for discharge home  Patient presented with an acute illness with associated systemic symptoms and significant discomfort requiring urgent management. In my opinion, this is a condition that a prudent lay person (someone who possesses an average knowledge of health and medicine) may potentially expect to result in complications if not addressed urgently such as respiratory distress, impairment of bodily function or dysfunction of bodily organs.   Routine symptom specific, illness specific and/or disease specific instructions were discussed with the patient and/or caregiver at length.   As such, the patient has been evaluated and assessed, work-up was performed and treatment was provided in alignment with urgent care protocols and evidence based medicine.  Patient/parent/caregiver has been advised that the patient may require follow up for further testing and treatment if the symptoms continue in spite of treatment, as clinically indicated and appropriate.  Patient/parent/caregiver has been advised to return to the [facility] or  PCP if no better; to PCP or the Emergency Department if new signs and symptoms develop, or if the current signs or symptoms continue to change or worsen for further workup, evaluation and treatment as clinically indicated and appropriate  The patient will follow up with their current PCP if and as advised. If the patient does not currently have a PCP we will assist them in obtaining one.   The patient may need specialty follow up if the symptoms continue, in spite of conservative treatment and management, for further workup, evaluation, consultation and treatment as clinically indicated and appropriate.  Patient/parent/caregiver verbalized understanding and agreement of plan as discussed.  All questions were addressed during visit.  Please see discharge instructions below for further details of plan.  This office note has been dictated using Teaching laboratory technician.  Unfortunately, this method of dictation can sometimes lead to typographical or grammatical errors.  I apologize for your inconvenience in advance if this occurs.  Please do not hesitate to reach out to me if clarification is needed.      Theadora Rama Scales, PA-C 02/08/24 1018

## 2024-05-09 ENCOUNTER — Ambulatory Visit (HOSPITAL_COMMUNITY)
Admission: EM | Admit: 2024-05-09 | Discharge: 2024-05-09 | Disposition: A | Discharge status: Home / Self Care | Payer: Self-pay | Attending: Family Medicine | Admitting: Family Medicine

## 2024-05-09 ENCOUNTER — Encounter (HOSPITAL_COMMUNITY): Payer: Self-pay

## 2024-05-09 DIAGNOSIS — K047 Periapical abscess without sinus: Secondary | ICD-10-CM

## 2024-05-09 MED ORDER — AMOXICILLIN-POT CLAVULANATE 875-125 MG PO TABS: 1.0000 | ORAL_TABLET | Freq: Two times a day (BID) | ORAL | 0 refills | Status: DC

## 2024-05-09 NOTE — Discharge Instructions (Signed)
 You were seen today for a dental infection.  I have sent out an oral antibiotic for this.  You may use tylenol /motrin for pain.  Please make an appointment with a dentist for further care.

## 2024-05-09 NOTE — ED Triage Notes (Signed)
 Pt c/o rt lower toothache for a week and a half. States taken ibuprofen and tylenol  with relief.

## 2024-05-09 NOTE — ED Provider Notes (Signed)
 [facility]    CSN: [id number] Arrival date & time: 05/09/24  1346      History   Chief Complaint Chief Complaint  Patient presents with   Dental Pain    HPI Kathleen Spencer is a 38 y.o. female.    Dental Pain  Patient is here or right lower dental pain x 10 days.  She has had an issue with this tooth in the past.  She does not have a dentist as of yet.  No fevers/chills.  No n/v.        Past Medical History:  Diagnosis Date   Gastritis    Left shoulder tendonitis    Right shoulder tendonitis     There are no active problems to display for this patient.   History reviewed. No pertinent surgical history.  OB History     Gravida  0   Para  0   Term  0   Preterm  0   AB  0   Living  0      SAB  0   IAB  0   Ectopic  0   Multiple  0   Live Births  0            Home Medications    Prior to Admission medications   Not on File    Family History History reviewed. No pertinent family history.  Social History Social History   Tobacco Use   Smoking status: Former    Types: Cigarettes  Vaping Use   Vaping status: Every Day  Substance Use Topics   Alcohol use: Not Currently   Drug use: Never     Allergies   Patient has no known allergies.   Review of Systems Review of Systems  Constitutional: Negative.   HENT:  Positive for dental problem.   Cardiovascular: Negative.   Gastrointestinal: Negative.   Musculoskeletal: Negative.   Psychiatric/Behavioral: Negative.       Physical Exam Triage Vital Signs ED Triage Vitals  Encounter Vitals Group     BP 05/09/24 1404 108/76     Systolic BP Percentile --      Diastolic BP Percentile --      Pulse Rate 05/09/24 1404 92     Resp 05/09/24 1404 18     Temp 05/09/24 1404 98.3 F (36.8 C)     Temp Source 05/09/24 1404 Oral     SpO2 05/09/24 1404 98 %     Weight --      Height --      Head Circumference --      Peak Flow --      Pain Score 05/09/24 1405 4      Pain Loc --      Pain Education --      Exclude from Growth Chart --    No data found.  Updated Vital Signs BP 108/76 (BP Location: Left Arm)   Pulse 92   Temp 98.3 F (36.8 C) (Oral)   Resp 18   LMP 04/23/2024 (Approximate)   SpO2 98%   Visual Acuity Right Eye Distance:   Left Eye Distance:   Bilateral Distance:    Right Eye Near:   Left Eye Near:    Bilateral Near:     Physical Exam Constitutional:      Appearance: Normal appearance. She is normal weight.  HENT:     Mouth/Throat:     Comments: Slight tenderness at the right lower jaw;  Broken  right lower molar;   Cardiovascular:     Rate and Rhythm: Normal rate and regular rhythm.  Pulmonary:     Effort: Pulmonary effort is normal.     Breath sounds: Normal breath sounds.  Musculoskeletal:     Cervical back: Neck supple. Tenderness present.  Lymphadenopathy:     Cervical: No cervical adenopathy.  Neurological:     General: No focal deficit present.     Mental Status: She is alert.  Psychiatric:        Mood and Affect: Mood normal.      UC Treatments / Results  Labs (all labs ordered are listed, but only abnormal results are displayed) Labs Reviewed - No data to display  EKG   Radiology No results found.  Procedures Procedures (including critical care time)  Medications Ordered in UC Medications - No data to display  Initial Impression / Assessment and Plan / UC Course  I have reviewed the triage vital signs and the nursing notes.  Pertinent labs & imaging results that were available during my care of the patient were reviewed by me and considered in my medical decision making (see chart for details).   Final Clinical Impressions(s) / UC Diagnoses   Final diagnoses:  Dental infection     Discharge Instructions      You were seen today for a dental infection.  I have sent out an oral antibiotic for this.  You may use tylenol /motrin for pain.  Please make an appointment with a  dentist for further care.    ED Prescriptions     Medication Sig Dispense Auth. Provider   amoxicillin -clavulanate (AUGMENTIN ) 875-125 MG tablet Take 1 tablet by mouth every 12 (twelve) hours. 14 tablet Lesle Ras, MD      PDMP not reviewed this encounter.   Lesle Ras, MD 05/09/24 1423

## 2024-06-01 ENCOUNTER — Encounter (HOSPITAL_COMMUNITY): Payer: Self-pay

## 2024-06-01 ENCOUNTER — Other Ambulatory Visit: Payer: Self-pay

## 2024-06-01 ENCOUNTER — Emergency Department (HOSPITAL_COMMUNITY)
Admission: EM | Admit: 2024-06-01 | Discharge: 2024-06-02 | Disposition: A | Discharge status: Home / Self Care | Payer: Self-pay | Attending: Emergency Medicine | Admitting: Emergency Medicine

## 2024-06-01 ENCOUNTER — Emergency Department (HOSPITAL_COMMUNITY): Payer: Self-pay

## 2024-06-01 DIAGNOSIS — R519 Headache, unspecified: Secondary | ICD-10-CM | POA: Insufficient documentation

## 2024-06-01 DIAGNOSIS — R0789 Other chest pain: Secondary | ICD-10-CM | POA: Insufficient documentation

## 2024-06-01 DIAGNOSIS — D649 Anemia, unspecified: Secondary | ICD-10-CM | POA: Insufficient documentation

## 2024-06-01 LAB — CBC WITH DIFFERENTIAL/PLATELET
Abs Immature Granulocytes: 0.02 10*3/uL (ref 0.00–0.07)
Basophils Absolute: 0 10*3/uL (ref 0.0–0.1)
Basophils Relative: 0 %
Eosinophils Absolute: 0.1 10*3/uL (ref 0.0–0.5)
Eosinophils Relative: 1 %
HCT: 19.1 % — ABNORMAL LOW (ref 36.0–46.0)
Hemoglobin: 6 g/dL — CL (ref 12.0–15.0)
Immature Granulocytes: 0 %
Lymphocytes Relative: 27 %
Lymphs Abs: 2 10*3/uL (ref 0.7–4.0)
MCH: 28.4 pg (ref 26.0–34.0)
MCHC: 31.4 g/dL (ref 30.0–36.0)
MCV: 90.5 fL (ref 80.0–100.0)
Monocytes Absolute: 0.5 10*3/uL (ref 0.1–1.0)
Monocytes Relative: 6 %
Neutro Abs: 4.9 10*3/uL (ref 1.7–7.7)
Neutrophils Relative %: 66 %
Platelets: 409 10*3/uL — ABNORMAL HIGH (ref 150–400)
RBC: 2.11 MIL/uL — ABNORMAL LOW (ref 3.87–5.11)
RDW: 16.2 % — ABNORMAL HIGH (ref 11.5–15.5)
WBC: 7.6 10*3/uL (ref 4.0–10.5)
nRBC: 0 % (ref 0.0–0.2)

## 2024-06-01 LAB — TROPONIN I (HIGH SENSITIVITY): Troponin I (High Sensitivity): 2 ng/L (ref <18)

## 2024-06-01 LAB — BASIC METABOLIC PANEL WITH GFR
Anion gap: 8 (ref 5–15)
BUN: 12 mg/dL (ref 6–20)
CO2: 20 mmol/L — ABNORMAL LOW (ref 22–32)
Calcium: 8.8 mg/dL — ABNORMAL LOW (ref 8.9–10.3)
Chloride: 108 mmol/L (ref 98–111)
Creatinine, Ser: 0.67 mg/dL (ref 0.44–1.00)
GFR, Estimated: >60 mL/min (ref >60)
Glucose, Bld: 95 mg/dL (ref 70–99)
Potassium: 3.6 mmol/L (ref 3.5–5.1)
Sodium: 136 mmol/L (ref 135–145)

## 2024-06-01 LAB — HCG, SERUM, QUALITATIVE: Preg, Serum: NEGATIVE

## 2024-06-01 MED ORDER — DIPHENHYDRAMINE HCL 50 MG/ML IJ SOLN
25.0000 mg | Freq: Once | INTRAMUSCULAR | Status: AC
  Administered 2024-06-01: 25 mg via INTRAVENOUS
  Filled 2024-06-01: qty 1

## 2024-06-01 MED ORDER — KETOROLAC TROMETHAMINE 30 MG/ML IJ SOLN
30.0000 mg | Freq: Once | INTRAMUSCULAR | Status: AC
  Administered 2024-06-01: 30 mg via INTRAVENOUS
  Filled 2024-06-01: qty 1

## 2024-06-01 MED ORDER — DEXAMETHASONE SODIUM PHOSPHATE 10 MG/ML IJ SOLN
10.0000 mg | Freq: Once | INTRAMUSCULAR | Status: AC
  Administered 2024-06-01: 10 mg via INTRAVENOUS
  Filled 2024-06-01: qty 1

## 2024-06-01 MED ORDER — SODIUM CHLORIDE 0.9 % IV BOLUS
1000.0000 mL | Freq: Once | INTRAVENOUS | Status: AC
  Administered 2024-06-01: 1000 mL via INTRAVENOUS

## 2024-06-01 MED ORDER — METOCLOPRAMIDE HCL 5 MG/ML IJ SOLN
10.0000 mg | Freq: Once | INTRAMUSCULAR | Status: AC
  Administered 2024-06-01: 10 mg via INTRAVENOUS
  Filled 2024-06-01: qty 2

## 2024-06-01 NOTE — ED Triage Notes (Signed)
 Pt presents with multiple complaints including:- chest pain, Toothache, headache, nausea, syncope 5 days ago. Pt on anbx for tooth infection

## 2024-06-01 NOTE — ED Provider Notes (Signed)
 [place] [facility] AT [facility] Provider Note   CSN: [id number] Arrival date & time: 06/01/24  2225     History  Chief Complaint  Patient presents with   Chest Pain    Toothache, headache, nausea, syncope 5 days ago    Kathleen Spencer is a 38 y.o. female.  Patient is a 38 year old female with past medical history of gastritis.  Patient presenting today with complaints of headache, tooth ache, and tightness in her chest.  She has been dealing with a dental problem for the past several months and has been on multiple antibiotics.  She started 3 days ago with headache she describes as a pressure in her head behind her eyes.  This has been constant and unrelieved with Excedrin.  She denies any fevers or chills.  No visual disturbances, weakness, or numbness.  She also reports feeling tight in the chest for the past several days, but denies any difficulty breathing.  No radiation to the arm or jaw.  No exertional symptoms.       Home Medications Prior to Admission medications   Medication Sig Start Date End Date Taking? Authorizing Provider  amoxicillin -clavulanate (AUGMENTIN ) 875-125 MG tablet Take 1 tablet by mouth every 12 (twelve) hours. 05/09/24   Lesle Ras, MD      Allergies    Patient has no known allergies.    Review of Systems   Review of Systems  All other systems reviewed and are negative.   Physical Exam Updated Vital Signs BP 123/79   Pulse 94   Temp 98.5 F (36.9 C) (Oral)   Resp 16   Ht 5\' 4"  (1.626 m)   Wt 61.2 kg   LMP 05/22/2024 (Approximate)   SpO2 100%   BMI 23.17 kg/m  Physical Exam Vitals and nursing note reviewed.  Constitutional:      General: She is not in acute distress.    Appearance: She is well-developed. She is not diaphoretic.  HENT:     Head: Normocephalic and atraumatic.  Eyes:     Extraocular Movements: Extraocular movements intact.     Pupils: Pupils are equal, round, and reactive to light.   Cardiovascular:     Rate and Rhythm: Normal rate and regular rhythm.     Heart sounds: No murmur heard.    No friction rub. No gallop.  Pulmonary:     Effort: Pulmonary effort is normal. No respiratory distress.     Breath sounds: Normal breath sounds. No wheezing.  Abdominal:     General: Bowel sounds are normal. There is no distension.     Palpations: Abdomen is soft.     Tenderness: There is no abdominal tenderness.  Musculoskeletal:        General: Normal range of motion.     Cervical back: Normal range of motion and neck supple.  Skin:    General: Skin is warm and dry.  Neurological:     General: No focal deficit present.     Mental Status: She is alert and oriented to person, place, and time.     Cranial Nerves: No cranial nerve deficit.     Motor: No weakness.     ED Results / Procedures / Treatments   Labs (all labs ordered are listed, but only abnormal results are displayed) Labs Reviewed  BASIC METABOLIC PANEL WITH GFR  CBC WITH DIFFERENTIAL/PLATELET  HCG, SERUM, QUALITATIVE  TROPONIN I (HIGH SENSITIVITY)    EKG EKG Interpretation Date/Time:  Sunday June 01 2024 22:37:18 EDT Ventricular Rate:  100 PR Interval:  118 QRS Duration:  84 QT Interval:  354 QTC Calculation: 457 R Axis:   83  Text Interpretation: Sinus tachycardia Confirmed by Early Glisson ([id number]) on 06/01/2024 10:39:49 PM  Radiology No results found.  Procedures Procedures  {Document cardiac monitor, telemetry assessment procedure when appropriate:1}  Medications Ordered in ED Medications  sodium chloride 0.9 % bolus 1,000 mL (has no administration in time range)  ketorolac  (TORADOL ) 30 MG/ML injection 30 mg (has no administration in time range)  metoCLOPramide (REGLAN) injection 10 mg (has no administration in time range)  dexamethasone (DECADRON) injection 10 mg (has no administration in time range)  diphenhydrAMINE (BENADRYL) injection 25 mg (has no administration in time range)     ED Course/ Medical Decision Making/ A&P   {   Click here for ABCD2, HEART and other calculatorsREFRESH Note before signing :1}                              Medical Decision Making Amount and/or Complexity of Data Reviewed Labs: ordered. Radiology: ordered.  Risk Prescription drug management.   ***  {Document critical care time when appropriate:1} {Document review of labs and clinical decision tools ie heart score, Chads2Vasc2 etc:1}  {Document your independent review of radiology images, and any outside records:1} {Document your discussion with family members, caretakers, and with consultants:1} {Document social determinants of health affecting pt's care:1} {Document your decision making why or why not admission, treatments were needed:1} Final Clinical Impression(s) / ED Diagnoses Final diagnoses:  None    Rx / DC Orders ED Discharge Orders     None

## 2024-06-02 LAB — POC OCCULT BLOOD, ED: Fecal Occult Blood: NEGATIVE

## 2024-06-02 LAB — ABO/RH: ABO/RH(D): A POS

## 2024-06-02 LAB — PREPARE RBC (CROSSMATCH)

## 2024-06-02 MED ORDER — SODIUM CHLORIDE 0.9% IV SOLUTION: Freq: Once | INTRAVENOUS | Status: AC

## 2024-06-02 MED ORDER — FERROUS SULFATE 325 (65 FE) MG PO TABS: 325.0000 mg | ORAL_TABLET | Freq: Two times a day (BID) | ORAL | 1 refills | Status: DC

## 2024-06-02 NOTE — Discharge Instructions (Signed)
 Begin taking iron sulfate as prescribed.  Follow-up with a primary doctor in the next 1 to 2 weeks for a recheck of your hemoglobin, and return to the ER if your symptoms significantly worsen or change.

## 2024-06-03 LAB — TYPE AND SCREEN
ABO/RH(D): A POS
Antibody Screen: NEGATIVE
Unit division: 0

## 2024-06-03 LAB — BPAM RBC
Blood Product Expiration Date: 202507062359
ISSUE DATE / TIME: 202506090128
Unit Type and Rh: 6200

## 2024-06-05 ENCOUNTER — Inpatient Hospital Stay
Admission: EM | Admit: 2024-06-05 | Discharge: 2024-06-07 | DRG: 378 | Disposition: A | Discharge status: Home / Self Care | Payer: Self-pay | Attending: Family Medicine | Admitting: Family Medicine

## 2024-06-05 ENCOUNTER — Emergency Department: Payer: Self-pay

## 2024-06-05 ENCOUNTER — Other Ambulatory Visit: Payer: Self-pay

## 2024-06-05 DIAGNOSIS — K0889 Other specified disorders of teeth and supporting structures: Secondary | ICD-10-CM | POA: Diagnosis present

## 2024-06-05 DIAGNOSIS — R519 Headache, unspecified: Secondary | ICD-10-CM | POA: Diagnosis present

## 2024-06-05 DIAGNOSIS — D500 Iron deficiency anemia secondary to blood loss (chronic): Secondary | ICD-10-CM

## 2024-06-05 DIAGNOSIS — I2489 Other forms of acute ischemic heart disease: Secondary | ICD-10-CM | POA: Diagnosis present

## 2024-06-05 DIAGNOSIS — E538 Deficiency of other specified B group vitamins: Secondary | ICD-10-CM | POA: Diagnosis present

## 2024-06-05 DIAGNOSIS — Z792 Long term (current) use of antibiotics: Secondary | ICD-10-CM

## 2024-06-05 DIAGNOSIS — Z79899 Other long term (current) drug therapy: Secondary | ICD-10-CM

## 2024-06-05 DIAGNOSIS — D509 Iron deficiency anemia, unspecified: Secondary | ICD-10-CM | POA: Diagnosis present

## 2024-06-05 DIAGNOSIS — K2970 Gastritis, unspecified, without bleeding: Secondary | ICD-10-CM | POA: Diagnosis present

## 2024-06-05 DIAGNOSIS — F17290 Nicotine dependence, other tobacco product, uncomplicated: Secondary | ICD-10-CM | POA: Diagnosis present

## 2024-06-05 DIAGNOSIS — R079 Chest pain, unspecified: Secondary | ICD-10-CM | POA: Diagnosis present

## 2024-06-05 DIAGNOSIS — T39315A Adverse effect of propionic acid derivatives, initial encounter: Secondary | ICD-10-CM | POA: Diagnosis present

## 2024-06-05 DIAGNOSIS — D649 Anemia, unspecified: Principal | ICD-10-CM | POA: Diagnosis present

## 2024-06-05 DIAGNOSIS — K254 Chronic or unspecified gastric ulcer with hemorrhage: Principal | ICD-10-CM | POA: Diagnosis present

## 2024-06-05 DIAGNOSIS — K3189 Other diseases of stomach and duodenum: Secondary | ICD-10-CM | POA: Diagnosis present

## 2024-06-05 DIAGNOSIS — D62 Acute posthemorrhagic anemia: Secondary | ICD-10-CM | POA: Diagnosis present

## 2024-06-05 HISTORY — DX: Anemia, unspecified: D64.9

## 2024-06-05 LAB — CBC
HCT: 19.8 % — ABNORMAL LOW (ref 36.0–46.0)
HCT: 23 % — ABNORMAL LOW (ref 36.0–46.0)
Hemoglobin: 6.2 g/dL — ABNORMAL LOW (ref 12.0–15.0)
Hemoglobin: 7.3 g/dL — ABNORMAL LOW (ref 12.0–15.0)
MCH: 28.2 pg (ref 26.0–34.0)
MCH: 28.1 pg (ref 26.0–34.0)
MCHC: 31.3 g/dL (ref 30.0–36.0)
MCHC: 31.7 g/dL (ref 30.0–36.0)
MCV: 90 fL (ref 80.0–100.0)
MCV: 88.5 fL (ref 80.0–100.0)
Platelets: 372 10*3/uL (ref 150–400)
Platelets: 326 K/uL (ref 150–400)
RBC: 2.2 MIL/uL — ABNORMAL LOW (ref 3.87–5.11)
RBC: 2.6 MIL/uL — ABNORMAL LOW (ref 3.87–5.11)
RDW: 17.2 % — ABNORMAL HIGH (ref 11.5–15.5)
RDW: 16.2 % — ABNORMAL HIGH (ref 11.5–15.5)
WBC: 8.7 10*3/uL (ref 4.0–10.5)
WBC: 10.1 K/uL (ref 4.0–10.5)
nRBC: 0 % (ref 0.0–0.2)
nRBC: 0 % (ref 0.0–0.2)

## 2024-06-05 LAB — BASIC METABOLIC PANEL WITH GFR
Anion gap: 8 (ref 5–15)
BUN: 15 mg/dL (ref 6–20)
CO2: 23 mmol/L (ref 22–32)
Calcium: 8.6 mg/dL — ABNORMAL LOW (ref 8.9–10.3)
Chloride: 105 mmol/L (ref 98–111)
Creatinine, Ser: 0.67 mg/dL (ref 0.44–1.00)
GFR, Estimated: >60 mL/min (ref >60)
Glucose, Bld: 110 mg/dL — ABNORMAL HIGH (ref 70–99)
Potassium: 3.5 mmol/L (ref 3.5–5.1)
Sodium: 136 mmol/L (ref 135–145)

## 2024-06-05 LAB — HEPATIC FUNCTION PANEL
ALT: 13 U/L (ref 0–44)
AST: 15 U/L (ref 15–41)
Albumin: 3.4 g/dL — ABNORMAL LOW (ref 3.5–5.0)
Alkaline Phosphatase: 28 U/L — ABNORMAL LOW (ref 38–126)
Bilirubin, Direct: <0.1 mg/dL (ref 0.0–0.2)
Total Bilirubin: 0.5 mg/dL (ref 0.0–1.2)
Total Protein: 6.4 g/dL — ABNORMAL LOW (ref 6.5–8.1)

## 2024-06-05 LAB — RETICULOCYTES
Immature Retic Fract: 23.7 % — ABNORMAL HIGH (ref 2.3–15.9)
RBC.: 2.1 MIL/uL — ABNORMAL LOW (ref 3.87–5.11)
Retic Count, Absolute: 115.5 10*3/uL (ref 19.0–186.0)
Retic Ct Pct: 5.5 % — ABNORMAL HIGH (ref 0.4–3.1)

## 2024-06-05 LAB — IRON AND TIBC
Iron: 85 ug/dL (ref 28–170)
Saturation Ratios: 18 % (ref 10.4–31.8)
TIBC: 468 ug/dL — ABNORMAL HIGH (ref 250–450)
UIBC: 383 ug/dL

## 2024-06-05 LAB — TROPONIN I (HIGH SENSITIVITY)
Troponin I (High Sensitivity): <2 ng/L (ref <18)
Troponin I (High Sensitivity): 3 ng/L

## 2024-06-05 LAB — FOLATE: Folate: 13 ng/mL (ref >5.9)

## 2024-06-05 LAB — PREPARE RBC (CROSSMATCH)

## 2024-06-05 LAB — FERRITIN: Ferritin: 3 ng/mL — ABNORMAL LOW (ref 11–307)

## 2024-06-05 LAB — APTT: aPTT: 23 s — ABNORMAL LOW (ref 24–36)

## 2024-06-05 MED ORDER — MORPHINE SULFATE (PF) 2 MG/ML IV SOLN: 2.0000 mg | INTRAVENOUS | Status: DC | PRN

## 2024-06-05 MED ORDER — SODIUM CHLORIDE 0.9% IV SOLUTION
Freq: Once | INTRAVENOUS | Status: AC
  Filled 2024-06-05: qty 250

## 2024-06-05 MED ORDER — PANTOPRAZOLE SODIUM 40 MG IV SOLR
40.0000 mg | Freq: Two times a day (BID) | INTRAVENOUS | Status: DC
  Administered 2024-06-05 – 2024-06-07 (×4): 40 mg via INTRAVENOUS
  Filled 2024-06-05 (×5): qty 10

## 2024-06-05 MED ORDER — SODIUM CHLORIDE 0.9 % IV BOLUS
1500.0000 mL | Freq: Once | INTRAVENOUS | Status: AC
  Administered 2024-06-05: 1500 mL via INTRAVENOUS

## 2024-06-05 MED ORDER — SODIUM CHLORIDE 0.9 % IV SOLN: 10.0000 mL/h | Freq: Once | INTRAVENOUS | Status: DC

## 2024-06-05 MED ORDER — OXYCODONE-ACETAMINOPHEN 5-325 MG PO TABS
1.0000 | ORAL_TABLET | ORAL | Status: DC | PRN
  Administered 2024-06-06 (×2): 1 via ORAL
  Filled 2024-06-05 (×2): qty 1

## 2024-06-05 MED ORDER — ACETAMINOPHEN 325 MG PO TABS
650.0000 mg | ORAL_TABLET | Freq: Four times a day (QID) | ORAL | Status: DC | PRN
  Administered 2024-06-05 – 2024-06-07 (×4): 650 mg via ORAL
  Filled 2024-06-05 (×4): qty 2

## 2024-06-05 MED ORDER — FERROUS SULFATE 325 (65 FE) MG PO TABS
325.0000 mg | ORAL_TABLET | Freq: Two times a day (BID) | ORAL | Status: DC
  Administered 2024-06-06 (×2): 325 mg via ORAL
  Filled 2024-06-05 (×2): qty 1

## 2024-06-05 MED ORDER — ONDANSETRON HCL 4 MG/2ML IJ SOLN
4.0000 mg | Freq: Three times a day (TID) | INTRAMUSCULAR | Status: DC | PRN
  Administered 2024-06-06: 4 mg via INTRAVENOUS
  Filled 2024-06-05: qty 2

## 2024-06-05 NOTE — ED Provider Notes (Signed)
 [facility] Provider Note    Event Date/Time   First MD Initiated Contact with Patient 06/05/24 1720     (approximate)   History   Chest Pain   HPI  Kathleen Spencer is a 38 year old female with history of gastritis presenting to the emergency department for evaluation of chest pain.  I reviewed her ER visit from 4 days ago at [facility].  At that time, her labs demonstrated significant anemia with a hemoglobin of 6.  She was transfused 1 unit of PRBCs and discharged with iron supplementation.  Patient reports she had history of anemia many years ago, but this resolved and she does not think she had blood drawn for several years prior to her recent ER visit.  Patient reports that she has not had significant recurrent headache, but she did have worsening chest pain today with associated shortness of breath worse with exertion leading her to present back to the ER.  She does report taking Excedrin for a headache.  Denies black or red stool.  Hemoccult negative during recent ER visit.     Physical Exam   Triage Vital Signs: ED Triage Vitals  Encounter Vitals Group     BP 06/05/24 1713 114/86     Girls Systolic BP Percentile --      Girls Diastolic BP Percentile --      Boys Systolic BP Percentile --      Boys Diastolic BP Percentile --      Pulse Rate 06/05/24 1713 (!) 110     Resp 06/05/24 1713 (!) 22     Temp 06/05/24 1713 98.4 F (36.9 C)     Temp Source 06/05/24 1713 Oral     SpO2 06/05/24 1713 100 %     Weight --      Height 06/05/24 1711 5' 4 (1.626 m)     Head Circumference --      Peak Flow --      Pain Score 06/05/24 1711 7     Pain Loc --      Pain Education --      Exclude from Growth Chart --     Most recent vital signs: Vitals:   06/05/24 2145 06/05/24 2211  BP: 112/61 108/87  Pulse: 83 79  Resp: 14 18  Temp:  98.6 F (37 C)  SpO2: 100% 100%     General: Awake, interactive  CV:  Tachycardia with regular rhythm, good  peripheral perfusion Resp:  Unlabored respirations, lungs clear to auscultation Abd:  Nondistended, soft, nontender Neuro:  Symmetric facial movement, fluid speech   ED Results / Procedures / Treatments   Labs (all labs ordered are listed, but only abnormal results are displayed) Labs Reviewed  BASIC METABOLIC PANEL WITH GFR - Abnormal; Notable for the following components:      Result Value   Glucose, Bld 110 (*)    Calcium 8.6 (*)    All other components within normal limits  CBC - Abnormal; Notable for the following components:   RBC 2.20 (*)    Hemoglobin 6.2 (*)    HCT 19.8 (*)    RDW 17.2 (*)    All other components within normal limits  HEPATIC FUNCTION PANEL - Abnormal; Notable for the following components:   Total Protein 6.4 (*)    Albumin 3.4 (*)    Alkaline Phosphatase 28 (*)    All other components within normal limits  CBC - Abnormal; Notable for the following components:  RBC 2.60 (*)    Hemoglobin 7.3 (*)    HCT 23.0 (*)    RDW 16.2 (*)    All other components within normal limits  APTT - Abnormal; Notable for the following components:   aPTT 23 (*)    All other components within normal limits  IRON AND TIBC - Abnormal; Notable for the following components:   TIBC 468 (*)    All other components within normal limits  FERRITIN - Abnormal; Notable for the following components:   Ferritin 3 (*)    All other components within normal limits  RETICULOCYTES - Abnormal; Notable for the following components:   Retic Ct Pct 5.5 (*)    RBC. 2.10 (*)    Immature Retic Fract 23.7 (*)    All other components within normal limits  FOLATE  CBC  CBC  HEMOGLOBIN A1C  LIPID PANEL  HIV ANTIBODY (ROUTINE TESTING W REFLEX)  VITAMIN B12  POC URINE PREG, ED  TYPE AND SCREEN  PREPARE RBC (CROSSMATCH)  PREPARE RBC (CROSSMATCH)  TROPONIN I (HIGH SENSITIVITY)  TROPONIN I (HIGH SENSITIVITY)  TROPONIN I (HIGH SENSITIVITY)     EKG EKG independently reviewed and  interpreted by myself demonstrates:  EKG demonstrates sinus tachycardia at a rate of 108, PR 98, QRS 78, QTc 439, no acute ST changes  RADIOLOGY Imaging independently reviewed and interpreted by myself demonstrates:  CXR without focal consolidation  Formal Radiology Read:  DG Chest 2 View Result Date: 06/05/2024 CLINICAL DATA:  Chest pain. EXAM: CHEST - 2 VIEW COMPARISON:  None Available. FINDINGS: The heart size and mediastinal contours are within normal limits. Both lungs are clear. The visualized skeletal structures are unremarkable. IMPRESSION: No active cardiopulmonary disease. Electronically Signed   By: Rosalene Colon M.D.   On: 06/05/2024 17:55    PROCEDURES:  Critical Care performed: Yes, see critical care procedure note(s)  CRITICAL CARE Performed by: Claria Crofts   Total critical care time: 31 minutes  Critical care time was exclusive of separately billable procedures and treating other patients.  Critical care was necessary to treat or prevent imminent or life-threatening deterioration.  Critical care was time spent personally by me on the following activities: development of treatment plan with patient and/or surrogate as well as nursing, discussions with consultants, evaluation of patient's response to treatment, examination of patient, obtaining history from patient or surrogate, ordering and performing treatments and interventions, ordering and review of laboratory studies, ordering and review of radiographic studies, pulse oximetry and re-evaluation of patient's condition.   Procedures   MEDICATIONS ORDERED IN ED: Medications  0.9 %  sodium chloride infusion (has no administration in time range)  pantoprazole (PROTONIX) injection 40 mg (40 mg Intravenous Given 06/05/24 2250)  ondansetron (ZOFRAN) injection 4 mg (has no administration in time range)  acetaminophen  (TYLENOL ) tablet 650 mg (650 mg Oral Given 06/05/24 2250)  morphine (PF) 2 MG/ML injection 2 mg (has no  administration in time range)  oxyCODONE-acetaminophen  (PERCOCET/ROXICET) 5-325 MG per tablet 1 tablet (has no administration in time range)  0.9 %  sodium chloride infusion (Manually program via Guardrails IV Fluids) (has no administration in time range)  ferrous sulfate tablet 325 mg (has no administration in time range)  sodium chloride 0.9 % bolus 1,500 mL (1,500 mLs Intravenous New Bag/Given 06/05/24 2303)     IMPRESSION / MDM / ASSESSMENT AND PLAN / ED COURSE  I reviewed the triage vital signs and the nursing notes.  Differential diagnosis includes, but is  not limited to, recurrent anemia, pneumonia, pneumothorax, much lower suspicion ACS  Patient's presentation is most consistent with acute presentation with potential threat to life or bodily function.  38 year old female presenting to the emergency department for evaluation of chest pain, seen recently and found to have significant anemia.  Mild tachycardia on presentation.  Labs with recurrent anemia with hemoglobin of 6.2 despite recent blood transfusion.  BMP and LFTs without critical derangement.  Negative troponin.  Given symptomatic anemia, patient was ordered to transfuse 1 unit PRBCs.  Does have a history of gastritis with recent NSAID use, but no overtly bloody stools noted or other obvious bleeding source.  Would expect hemoglobin of 7 given recent transfusion at 6.  I am concerned about progressive symptomatic anemia.  Do think she is appropriate for admission in the setting of this.  Will reach out to hospitalist team.  FINAL CLINICAL IMPRESSION(S) / ED DIAGNOSES   Final diagnoses:  Symptomatic anemia     Rx / DC Orders   ED Discharge Orders     None        Note:  This document was prepared using Dragon voice recognition software and may include unintentional dictation errors.   Claria Crofts, MD 06/05/24 [phone]

## 2024-06-05 NOTE — ED Triage Notes (Signed)
 Pt to ed from work via POV for CP/SOB. Pt was seen on 6/8 and kept over night for blood transfusion. Pt is caox4, in no acute distress and ambulatory in triage.

## 2024-06-05 NOTE — H&P (Signed)
 History and Physical    AMBRY DIX Spencer:[id number] DOB: 05/17/1986 DOA: 06/05/2024  Referring MD/NP/PA:   PCP: Patient, No Pcp Per   Patient coming from:  The patient is coming from home.     Chief Complaint: SOB, chest pain, epigastric abdominal pain  HPI: Kathleen Spencer is a 38 y.o. female with medical history significant of gastritis, anemia, shoulder tendinitis, who presents with SOB, chest pain, epigastric abdominal pain.  Patient was recently seen in ED at [facility] on 6/8 due to headache, chest pain and syncope.  Patient had negative head CT scan for acute intracranial abnormalities.  She was found to have anemia with hemoglobin 6.0.  Patient was transfused with 1 unit of blood and started on iron supplement.  She states that she continues to have SOB and chest pain.  The chest pain is located in the substernal area, pressure-like, tightness feeling, nonradiating, aggravated by exertion.  No cough, fever or chills.  She also reports epigastric abdominal pain, which is intermittent, aching, mild to moderate, nonradiating.  No symptoms of UTI.  Denies rectal bleeding or dark stool.  Her last menstrual period was a week ago.  Her menstrual period is regular, slightly heavy which is not concerning for patient.  Of note, patient recently had dental issue and finished a course of amoxicillin  treatment.  She took ibuprofen for dental pain.  She also took Excedrin for headache.  Today her headache is mild.   Data reviewed independently and ED Course: pt was found to have hemoglobin 6.2, GFR> 60, liver function normal, troponin< 2.0, temperature normal, blood pressure 98/75, heart rate 110 --> 88, RR 22, oxygen saturation 100% on room air.  Chest x-ray negative.  Patient is placed in telemetry bed for physician.  Epic message is scheduled to send to Dr. Emerick Hanlon of GI for consult at 6 AM   EKG: I have personally reviewed.  Sinus rhythm, QTc 439, early R wave aggression, nonspecific T wave  change.   Review of Systems:   General: no fevers, chills, no body weight gain, has fatigue HEENT: no blurry vision, hearing changes or sore throat Respiratory: has dyspnea, no coughing, wheezing CV: has chest pain, no palpitations GI: has nausea, abdominal pain, no diarrhea, constipation, vomiting, GU: no dysuria, burning on urination, increased urinary frequency, hematuria  Ext: no leg edema Neuro: no unilateral weakness, numbness, or tingling, no vision change or hearing loss Skin: no rash, no skin tear. MSK: No muscle spasm, no deformity, no limitation of range of movement in spin Heme: No easy bruising.  Travel history: No recent long distant travel.   Allergy: No Known Allergies  Past Medical History:  Diagnosis Date   Anemia    Gastritis    Left shoulder tendonitis    Right shoulder tendonitis     History reviewed. No pertinent surgical history.  Social History:  reports that she has quit smoking. Her smoking use included cigarettes. She has never used smokeless tobacco. She reports that she does not currently use alcohol. She reports that she does not use drugs.  Family History:  Family History  Problem Relation Age of Onset   Lupus Mother    Diabetes Father      Prior to Admission medications   Medication Sig Start Date End Date Taking? Authorizing Provider  amoxicillin -clavulanate (AUGMENTIN ) 875-125 MG tablet Take 1 tablet by mouth every 12 (twelve) hours. 05/09/24   Piontek, Cleveland Dales, MD  ferrous sulfate 325 (65 FE) MG tablet Take 1  tablet (325 mg total) by mouth 2 (two) times daily with a meal. 06/02/24   Orvilla Blander, MD    Physical Exam: Vitals:   06/05/24 2000 06/05/24 2003 06/05/24 2015 06/05/24 2045  BP: 90/75 90/75 93/67  101/66  Pulse: 87 88 85 88  Resp: (!) 21 18 16 15   Temp:  98.7 F (37.1 C)    TempSrc:  Oral    SpO2: 100%  100% 100%  Height:       General: Not in acute distress.  Pale looking HEENT:       Eyes: PERRL, EOMI, no jaundice        ENT: No discharge from the ears and nose, no pharynx injection, no tonsillar enlargement.        Neck: No JVD, no bruit, no mass felt. Heme: No neck lymph node enlargement. Cardiac: S1/S2, RRR, No murmurs, No gallops or rubs. Respiratory: No rales, wheezing, rhonchi or rubs. GI: Soft, nondistended, mild tenderness in epigastric area, no rebound pain, no organomegaly, BS present. GU: No hematuria Ext: No pitting leg edema bilaterally. 1+DP/PT pulse bilaterally. Musculoskeletal: No joint deformities, No joint redness or warmth, no limitation of ROM in spin. Skin: No rashes.  Neuro: Alert, oriented X3, cranial nerves II-XII grossly intact, moves all extremities normally. Psych: Patient is not psychotic, no suicidal or hemocidal ideation.  Labs on Admission: I have personally reviewed following labs and imaging studies  CBC: Recent Labs  Lab 06/01/24 2329 06/05/24 1713  WBC 7.6 8.7  NEUTROABS 4.9  --   HGB 6.0* 6.2*  HCT 19.1* 19.8*  MCV 90.5 90.0  PLT 409* 372   Basic Metabolic Panel: Recent Labs  Lab 06/01/24 2329 06/05/24 1713  NA 136 136  K 3.6 3.5  CL 108 105  CO2 20* 23  GLUCOSE 95 110*  BUN 12 15  CREATININE 0.67 0.67  CALCIUM 8.8* 8.6*   GFR: Estimated Creatinine Clearance: 80.7 mL/min (by C-G formula based on SCr of 0.67 mg/dL). Liver Function Tests: Recent Labs  Lab 06/05/24 1731  AST 15  ALT 13  ALKPHOS 28*  BILITOT 0.5  PROT 6.4*  ALBUMIN 3.4*   No results for input(s): LIPASE, AMYLASE in the last 168 hours. No results for input(s): AMMONIA in the last 168 hours. Coagulation Profile: No results for input(s): INR, PROTIME in the last 168 hours. Cardiac Enzymes: No results for input(s): CKTOTAL, CKMB, CKMBINDEX, TROPONINI in the last 168 hours. BNP (last 3 results) No results for input(s): PROBNP in the last 8760 hours. HbA1C: No results for input(s): HGBA1C in the last 72 hours. CBG: No results for input(s): GLUCAP in  the last 168 hours. Lipid Profile: No results for input(s): CHOL, HDL, LDLCALC, TRIG, CHOLHDL, LDLDIRECT in the last 72 hours. Thyroid Function Tests: No results for input(s): TSH, T4TOTAL, FREET4, T3FREE, THYROIDAB in the last 72 hours. Anemia Panel: No results for input(s): VITAMINB12, FOLATE, FERRITIN, TIBC, IRON, RETICCTPCT in the last 72 hours. Urine analysis:    Component Value Date/Time   COLORURINE YELLOW 11/10/2008 1724   APPEARANCEUR CLEAR 11/10/2008 1724   LABSPEC 1.027 11/10/2008 1724   PHURINE 5.5 11/10/2008 1724   GLUCOSEU NEGATIVE 11/10/2008 1724   HGBUR SMALL (A) 11/10/2008 1724   BILIRUBINUR NEGATIVE 11/10/2008 1724   KETONESUR NEGATIVE 11/10/2008 1724   PROTEINUR NEGATIVE 11/10/2008 1724   UROBILINOGEN 0.2 11/10/2008 1724   NITRITE NEGATIVE 11/10/2008 1724   LEUKOCYTESUR NEGATIVE 11/10/2008 1724   Sepsis Labs: @LABRCNTIP (procalcitonin:4,lacticidven:4) )No results found for this or any  previous visit (from the past 240 hours).   Radiological Exams on Admission:   Assessment/Plan Principal Problem:   Symptomatic anemia Active Problems:   Gastritis   Chest pain   Assessment and Plan:  Symptomatic anemia: Patient received 1 unit of blood on 6/8. Her hemoglobin was 6 at that time, which ideally showed increased to 7.  Her hemoglobin is 6.2 today, which is lower than expected, likely due to new blood loss.  Patient has history of gastritis, taking ibuprofen and Excedrin, suspecting GI bleeding. Epic message is scheduled to send to Dr. Emerick Hanlon of GI for consult at 6 AM  - will place in tele bed for obs - transfuse 1 unit of blood now - NPO after MN, pending GI consult.  - IVF: 1.5L NS bolus - Start IV pantoprazole, 40 mg bid - Check anemia panel - Zofran IV for nausea - Avoid NSAIDs and SQ heparin - Maintain IV access (2 large bore IVs if possible). - Monitor closely and follow q6h cbc, transfuse as necessary, if  Hgb<7.0 - LaB: INR, PTT and type screen  Gastritis -on IV protonix  Chest pain: Likely due to demand ischemia secondary to anemia.  Initial troponin negative. - Trend troponin - Blood transfusion with 1 unit of blood - As needed morphine, Percocet for chest pain      DVT ppx: SCD  Code Status: Full code    Family Communication:    Yes, patient's parents at bed side.      Disposition Plan:  Anticipate discharge back to previous environment  Consults called:   Epic message is scheduled to send to Dr. Emerick Hanlon of GI for consult at 6 AM  Admission status and Level of care: Telemetry Medical:    for obs     Dispo: The patient is from: Home              Anticipated d/c is to: Home              Anticipated d/c date is: 1 day              Patient currently is not medically stable to d/c.    Severity of Illness:  The appropriate patient status for this patient is OBSERVATION. Observation status is judged to be reasonable and necessary in order to provide the required intensity of service to ensure the patient's safety. The patient's presenting symptoms, physical exam findings, and initial radiographic and laboratory data in the context of their medical condition is felt to place them at decreased risk for further clinical deterioration. Furthermore, it is anticipated that the patient will be medically stable for discharge from the hospital within 2 midnights of admission.        Date of Service 06/05/2024    Fidencio Hue [facility]   If 7PM-7AM, please contact night-coverage [email or web address] 06/05/2024, 9:12 PM

## 2024-06-06 DIAGNOSIS — D62 Acute posthemorrhagic anemia: Secondary | ICD-10-CM | POA: Diagnosis present

## 2024-06-06 DIAGNOSIS — D649 Anemia, unspecified: Secondary | ICD-10-CM

## 2024-06-06 LAB — CBC
HCT: 20.3 % — ABNORMAL LOW (ref 36.0–46.0)
HCT: 21.8 % — ABNORMAL LOW (ref 36.0–46.0)
HCT: 27.5 % — ABNORMAL LOW (ref 36.0–46.0)
Hemoglobin: 6.6 g/dL — ABNORMAL LOW (ref 12.0–15.0)
Hemoglobin: 6.9 g/dL — ABNORMAL LOW (ref 12.0–15.0)
Hemoglobin: 8.8 g/dL — ABNORMAL LOW (ref 12.0–15.0)
MCH: 28.6 pg (ref 26.0–34.0)
MCH: 28 pg (ref 26.0–34.0)
MCH: 28.5 pg (ref 26.0–34.0)
MCHC: 32.5 g/dL (ref 30.0–36.0)
MCHC: 31.7 g/dL (ref 30.0–36.0)
MCHC: 32 g/dL (ref 30.0–36.0)
MCV: 87.9 fL (ref 80.0–100.0)
MCV: 88.6 fL (ref 80.0–100.0)
MCV: 89 fL (ref 80.0–100.0)
Platelets: 270 10*3/uL (ref 150–400)
Platelets: 283 10*3/uL (ref 150–400)
Platelets: 297 10*3/uL (ref 150–400)
RBC: 2.31 MIL/uL — ABNORMAL LOW (ref 3.87–5.11)
RBC: 2.46 MIL/uL — ABNORMAL LOW (ref 3.87–5.11)
RBC: 3.09 MIL/uL — ABNORMAL LOW (ref 3.87–5.11)
RDW: 16.7 % — ABNORMAL HIGH (ref 11.5–15.5)
RDW: 17.3 % — ABNORMAL HIGH (ref 11.5–15.5)
RDW: 16.9 % — ABNORMAL HIGH (ref 11.5–15.5)
WBC: 8.5 10*3/uL (ref 4.0–10.5)
WBC: 8.3 10*3/uL (ref 4.0–10.5)
WBC: 7.3 10*3/uL (ref 4.0–10.5)
nRBC: 0 % (ref 0.0–0.2)
nRBC: 0 % (ref 0.0–0.2)
nRBC: 0 % (ref 0.0–0.2)

## 2024-06-06 LAB — LIPID PANEL
Cholesterol: 144 mg/dL (ref 0–200)
HDL: 40 mg/dL — ABNORMAL LOW (ref >40)
LDL Cholesterol: 68 mg/dL (ref 0–99)
Total CHOL/HDL Ratio: 3.6 ratio
Triglycerides: 178 mg/dL — ABNORMAL HIGH (ref <150)
VLDL: 36 mg/dL (ref 0–40)

## 2024-06-06 LAB — HEMOGLOBIN A1C
Hgb A1c MFr Bld: 4.5 % — ABNORMAL LOW (ref 4.8–5.6)
Mean Plasma Glucose: 82.45 mg/dL

## 2024-06-06 LAB — TROPONIN I (HIGH SENSITIVITY)
Troponin I (High Sensitivity): 3 ng/L (ref <18)
Troponin I (High Sensitivity): 3 ng/L (ref <18)

## 2024-06-06 LAB — GLUCOSE, CAPILLARY
Glucose-Capillary: 93 mg/dL (ref 70–99)
Glucose-Capillary: 119 mg/dL — ABNORMAL HIGH (ref 70–99)

## 2024-06-06 LAB — HIV ANTIBODY (ROUTINE TESTING W REFLEX): HIV Screen 4th Generation wRfx: NONREACTIVE

## 2024-06-06 LAB — VITAMIN B12: Vitamin B-12: 144 pg/mL — ABNORMAL LOW (ref 180–914)

## 2024-06-06 MED ORDER — SODIUM CHLORIDE 0.9% IV SOLUTION: Freq: Once | INTRAVENOUS | Status: AC

## 2024-06-06 MED ORDER — PEG 3350-KCL-NA BICARB-NACL 420 G PO SOLR
4000.0000 mL | Freq: Once | ORAL | Status: AC
  Administered 2024-06-06: 4000 mL via ORAL
  Filled 2024-06-06: qty 4000

## 2024-06-06 NOTE — Progress Notes (Signed)
 PROGRESS NOTE    Kathleen Spencer  JYN:[id number] DOB: 1986/11/04 DOA: 06/05/2024 PCP: Patient, No Pcp Per  Chief Complaint  Patient presents with   Chest Pain    Hospital Course:  Kathleen Spencer is a 38 year old female with history of gastritis, anemia, shoulder tendinitis, who presents with shortness of breath, chest pain, epigastric abdominal pain.  She was recently seen on 6/8 at [facility], ED for headache, chest pain, syncope.  During that admission she was found to have anemia with a hemoglobin of 6.0.  She received 1 unit PRBC with started on iron supplementation.  She presents again on this admission and hemoglobin is 6.2, blood pressure 98/75, tachycardic to 110.  CXR was unremarkable.  Gastroenterology was consulted.  Subjective: This morning patient reports mild headache, no abdominal pain.  Does report that she is feeling better after transfusion overnight.   Objective: Vitals:   06/06/24 0722 06/06/24 1127 06/06/24 1335 06/06/24 1420  BP: 108/75 97/67 96/68  94/63  Pulse: 88 82 86 83  Resp: 12 18 16 16   Temp: 99 F (37.2 C) 98.2 F (36.8 C) 98.4 F (36.9 C) 99.1 F (37.3 C)  TempSrc: Oral Oral  Oral  SpO2: 99% 100% 98% 99%  Weight:      Height:        Intake/Output Summary (Last 24 hours) at 06/06/2024 1541 Last data filed at 06/06/2024 1300 Gross per 24 hour  Intake 612.33 ml  Output --  Net 612.33 ml   Filed Weights   06/05/24 2211 06/06/24 0612  Weight: 71.9 kg 61.5 kg    Examination: General exam: Appears calm and comfortable, NAD  Respiratory system: No work of breathing, symmetric chest wall expansion Cardiovascular system: S1 & S2 heard, RRR.  Gastrointestinal system: Abdomen is nondistended, soft and nontender.  Neuro: Alert and oriented. No focal neurological deficits. Extremities: Symmetric, expected ROM Skin: No rashes, lesions, pale Psychiatry: Demonstrates appropriate judgement and insight. Mood & affect appropriate for situation.    Assessment & Plan:  Principal Problem:   Symptomatic anemia Active Problems:   Gastritis   Chest pain   Acute blood loss anemia    Symptomatic anemia - Status post 1 unit PRBC on 6/8 - 2 units PRBCs this admission - Continue to trend H&H - Does have history of gastritis and has been taking ibuprofen and Excedrin, with recent increase due to tooth pain - Denies history of menorrhagia.  Last menses 2 weeks ago. - GI consulted - Planning for EGD and colonoscopy tomorrow - Continue with PPI twice daily - Anemia panel: Elevated TIBC, low ferritin.  Folate, iron WNL. - Avoid NSAIDs - Maintain 2 large-bore IVs - Continue on telemetry  Chest pain - Noncardiac - Troponin negative with unremarkable trend - Likely secondary to symptomatic anemia as above  DVT prophylaxis: SCDs for now   Code Status: Full Code Disposition:  Pending EGD and colonoscopy tomorrow  Consultants:  Treatment Team:  Consulting Physician: Marnee Sink, MD  Procedures:    Antimicrobials:  Anti-infectives (From admission, onward)    None       Data Reviewed: I have personally reviewed following labs and imaging studies CBC: Recent Labs  Lab 06/01/24 2329 06/05/24 1713 06/05/24 2216 06/06/24 0354 06/06/24 0814  WBC 7.6 8.7 10.1 8.5 8.3  NEUTROABS 4.9  --   --   --   --   HGB 6.0* 6.2* 7.3* 6.6* 6.9*  HCT 19.1* 19.8* 23.0* 20.3* 21.8*  MCV 90.5 90.0 88.5 87.9  88.6  PLT 409* 372 326 270 283   Basic Metabolic Panel: Recent Labs  Lab 06/01/24 2329 06/05/24 1713  NA 136 136  K 3.6 3.5  CL 108 105  CO2 20* 23  GLUCOSE 95 110*  BUN 12 15  CREATININE 0.67 0.67  CALCIUM 8.8* 8.6*   GFR: Estimated Creatinine Clearance: 80.7 mL/min (by C-G formula based on SCr of 0.67 mg/dL). Liver Function Tests: Recent Labs  Lab 06/05/24 1731  AST 15  ALT 13  ALKPHOS 28*  BILITOT 0.5  PROT 6.4*  ALBUMIN 3.4*   CBG: Recent Labs  Lab 06/06/24 0724 06/06/24 1126  GLUCAP 93 119*    No  results found for this or any previous visit (from the past 240 hours).   Radiology Studies: DG Chest 2 View Result Date: 06/05/2024 CLINICAL DATA:  Chest pain. EXAM: CHEST - 2 VIEW COMPARISON:  None Available. FINDINGS: The heart size and mediastinal contours are within normal limits. Both lungs are clear. The visualized skeletal structures are unremarkable. IMPRESSION: No active cardiopulmonary disease. Electronically Signed   By: Rosalene Colon M.D.   On: 06/05/2024 17:55    Scheduled Meds:  ferrous sulfate   325 mg Oral BID WC   pantoprazole  (PROTONIX ) IV  40 mg Intravenous Q12H   polyethylene glycol-electrolytes  4,000 mL Oral Once   Continuous Infusions:  sodium chloride        LOS: 0 days  MDM: Patient is high risk for one or more organ failure.  They necessitate ongoing hospitalization for continued IV therapies and subsequent lab monitoring. Total time spent interpreting labs and vitals, reviewing the medical record, coordinating care amongst consultants and care team members, directly assessing and discussing care with the patient and/or family: 55 min  Jeani Fassnacht, DO [facility]  To contact the attending physician between 7A-7P please use Epic Chat. To contact the covering physician during after hours 7P-7A, please review Amion.  06/06/2024, 3:41 PM   *This document has been created with the assistance of dictation software. Please excuse typographical errors. *

## 2024-06-06 NOTE — Consult Note (Signed)
 Marnee Sink, MD [facility]  [address], [address] [place], [place] [address] Phone: [phone] Fax : [phone]  Consultation  Referring Provider:     Dr. Rosalea Collin  Primary Care Physician:  Patient, No Pcp Per Primary Gastroenterologist: Para Bold         Reason for Consultation:     Anemia  Date of Admission:  06/05/2024 Date of Consultation:  06/06/2024         HPI:   Kathleen Spencer is a 38 y.o. female who had been in the emergency department 5 days ago for toothache with nausea and reported that 20 years prior she was told by an ER doctor that she had gastritis but has never had an upper endoscopy or colonoscopy.  The patient also had some chest tightness.  The patient states that she has been having headaches and described the headaches as behind her eye and had been taking Excedrin and other NSAIDs.  The patient states she usually takes Tylenol  but only recently started taking NSAIDs.  5 days ago the patient was noted to have a low hemoglobin at 6 and was given a unit of blood and sent home on iron supplementation and was told that she had heme-negative stools.  The patient comes in again last night with a history of chest pain and her hemoglobin was again noted to be low at 7.2 with a repeat being 7.3 and the hemoglobin this morning being 6.6.  The patient denies having heavy periods and her last menstrual cycle was a few weeks ago.  She also denies any black stools or bloody stools.  She is not having any diarrhea.  The patient reports that she has had abdominal pain for 20 years across the entire upper part of her abdomen.  She also reports that taking NSAIDs has been exacerbating her pain.  She denies ever having an EGD or colonoscopy in the past.  Past Medical History:  Diagnosis Date   Anemia    Gastritis    Left shoulder tendonitis    Right shoulder tendonitis     History reviewed. No pertinent surgical history.  Prior to Admission medications   Medication Sig Start  Date End Date Taking? Authorizing Provider  ferrous sulfate  325 (65 FE) MG tablet Take 1 tablet (325 mg total) by mouth 2 (two) times daily with a meal. 06/02/24  Yes Delo, Dufm Gibbon, MD  amoxicillin -clavulanate (AUGMENTIN ) 875-125 MG tablet Take 1 tablet by mouth every 12 (twelve) hours. Patient not taking: Reported on 06/05/2024 05/09/24   Lesle Ras, MD    Family History  Problem Relation Age of Onset   Lupus Mother    Diabetes Father      Social History   Tobacco Use   Smoking status: Former    Types: Cigarettes   Smokeless tobacco: Never  Vaping Use   Vaping status: Every Day  Substance Use Topics   Alcohol use: Not Currently   Drug use: Never    Allergies as of 06/05/2024   (No Known Allergies)    Review of Systems:    All systems reviewed and negative except where noted in HPI.   Physical Exam:  Vital signs in last 24 hours: Temp:  [98.4 F (36.9 C)-99 F (37.2 C)] 99 F (37.2 C) (06/13 0722) Pulse Rate:  [79-110] 88 (06/13 0722) Resp:  [12-22] 12 (06/13 0722) BP: (90-114)/(61-87) 108/75 (06/13 0722) SpO2:  [90 %-100 %] 99 % (06/13 0722) Weight:  [61.5 kg-71.9 kg]  61.5 kg (06/13 0612) Last BM Date : 06/05/24 General:   Pleasant, cooperative in NAD Head:  Normocephalic and atraumatic. Eyes:   No icterus.   Conjunctiva pink. PERRLA. Ears:  Normal auditory acuity. Neck:  Supple; no masses or thyroidomegaly Lungs: Respirations even and unlabored. Lungs clear to auscultation bilaterally.   No wheezes, crackles, or rhonchi.  Heart:  Regular rate and rhythm;  Without murmur, clicks, rubs or gallops Abdomen:  Soft, nondistended, nontender. Normal bowel sounds. No appreciable masses or hepatomegaly.  No rebound or guarding.  Rectal:  Not performed. Msk:  Symmetrical without gross deformities.    Extremities:  Without edema, cyanosis or clubbing. Neurologic:  Alert and oriented x3;  grossly normal neurologically. Skin:  Intact without significant lesions or  rashes. Cervical Nodes:  No significant cervical adenopathy. Psych:  Alert and cooperative. Normal affect.  LAB RESULTS: Recent Labs    06/05/24 1713 06/05/24 2216 06/06/24 0354  WBC 8.7 10.1 8.5  HGB 6.2* 7.3* 6.6*  HCT 19.8* 23.0* 20.3*  PLT 372 326 270   BMET Recent Labs    06/05/24 1713  NA 136  K 3.5  CL 105  CO2 23  GLUCOSE 110*  BUN 15  CREATININE 0.67  CALCIUM 8.6*   LFT Recent Labs    06/05/24 1731  PROT 6.4*  ALBUMIN 3.4*  AST 15  ALT 13  ALKPHOS 28*  BILITOT 0.5  BILIDIR <0.1  IBILI NOT CALCULATED   PT/INR No results for input(s): LABPROT, INR in the last 72 hours.  STUDIES: DG Chest 2 View Result Date: 06/05/2024 CLINICAL DATA:  Chest pain. EXAM: CHEST - 2 VIEW COMPARISON:  None Available. FINDINGS: The heart size and mediastinal contours are within normal limits. Both lungs are clear. The visualized skeletal structures are unremarkable. IMPRESSION: No active cardiopulmonary disease. Electronically Signed   By: Rosalene Colon M.D.   On: 06/05/2024 17:55      Impression / Plan:   Assessment: Principal Problem:   Symptomatic anemia Active Problems:   Gastritis   Chest pain   Kathleen Spencer is a 38 y.o. y/o female with chronic anemia with her hemoglobin being 9.415 years ago.  The patient does not have a primary care doctor but 5 days ago the hemoglobin was 6.0 and she returned with a hemoglobin of 6.2 yesterday.  The patient's hemoglobin was 6.6 this morning.  She continues to deny any sign of GI bleeding.  The patient's ferritin was 3 with normal iron and iron saturation.  The patient's B12 was also noted to be low at 144.  Plan:  Due to the patient's low ferritin and anemia the patient will be set up for an EGD and colonoscopy.  This anemia is likely multifactorial in is not explained by only chronic blood loss.  The patient will be given a prep tonight and will undergo an EGD and colonoscopy tomorrow by Dr. Baldomero Bone.  The patient has been  explained the plan and agrees with it.  Thank you for involving me in the care of this patient.      LOS: 0 days   Marnee Sink, MD, MD. Sylvan Evener 06/06/2024, 7:34 AM,  Pager [phone] 7am-5pm  Check AMION for 5pm -7am coverage and on weekends   Note: This dictation was prepared with Dragon dictation along with smaller phrase technology. Any transcriptional errors that result from this process are unintentional.

## 2024-06-06 NOTE — Plan of Care (Signed)

## 2024-06-07 ENCOUNTER — Inpatient Hospital Stay: Payer: Self-pay | Admitting: Anesthesiology

## 2024-06-07 ENCOUNTER — Encounter: Payer: Self-pay | Admitting: Family Medicine

## 2024-06-07 ENCOUNTER — Encounter
Admission: EM | Disposition: A | Discharge status: Home / Self Care | Payer: Self-pay | Source: Home / Self Care | Attending: Internal Medicine

## 2024-06-07 DIAGNOSIS — D509 Iron deficiency anemia, unspecified: Secondary | ICD-10-CM

## 2024-06-07 DIAGNOSIS — R079 Chest pain, unspecified: Secondary | ICD-10-CM

## 2024-06-07 DIAGNOSIS — D62 Acute posthemorrhagic anemia: Secondary | ICD-10-CM

## 2024-06-07 DIAGNOSIS — K2970 Gastritis, unspecified, without bleeding: Secondary | ICD-10-CM

## 2024-06-07 HISTORY — PX: ESOPHAGOGASTRODUODENOSCOPY: SHX5428

## 2024-06-07 HISTORY — PX: COLONOSCOPY: SHX5424

## 2024-06-07 LAB — CBC
HCT: 23.7 % — ABNORMAL LOW (ref 36.0–46.0)
HCT: 23.7 % — ABNORMAL LOW (ref 36.0–46.0)
Hemoglobin: 7.7 g/dL — ABNORMAL LOW (ref 12.0–15.0)
Hemoglobin: 7.7 g/dL — ABNORMAL LOW (ref 12.0–15.0)
MCH: 28.6 pg (ref 26.0–34.0)
MCH: 28.7 pg (ref 26.0–34.0)
MCHC: 32.5 g/dL (ref 30.0–36.0)
MCHC: 32.5 g/dL (ref 30.0–36.0)
MCV: 88.1 fL (ref 80.0–100.0)
MCV: 88.4 fL (ref 80.0–100.0)
Platelets: 248 10*3/uL (ref 150–400)
Platelets: 255 10*3/uL (ref 150–400)
RBC: 2.68 MIL/uL — ABNORMAL LOW (ref 3.87–5.11)
RBC: 2.69 MIL/uL — ABNORMAL LOW (ref 3.87–5.11)
RDW: 16.9 % — ABNORMAL HIGH (ref 11.5–15.5)
RDW: 17 % — ABNORMAL HIGH (ref 11.5–15.5)
WBC: 5.9 10*3/uL (ref 4.0–10.5)
WBC: 6.6 10*3/uL (ref 4.0–10.5)
nRBC: 0 % (ref 0.0–0.2)
nRBC: 0 % (ref 0.0–0.2)

## 2024-06-07 LAB — COMPREHENSIVE METABOLIC PANEL WITH GFR
ALT: 16 U/L (ref 0–44)
AST: 16 U/L (ref 15–41)
Albumin: 3 g/dL — ABNORMAL LOW (ref 3.5–5.0)
Alkaline Phosphatase: 23 U/L — ABNORMAL LOW (ref 38–126)
Anion gap: 7 (ref 5–15)
BUN: 7 mg/dL (ref 6–20)
CO2: 24 mmol/L (ref 22–32)
Calcium: 8.2 mg/dL — ABNORMAL LOW (ref 8.9–10.3)
Chloride: 109 mmol/L (ref 98–111)
Creatinine, Ser: 0.66 mg/dL (ref 0.44–1.00)
GFR, Estimated: >60 mL/min (ref >60)
Glucose, Bld: 92 mg/dL (ref 70–99)
Potassium: 3.4 mmol/L — ABNORMAL LOW (ref 3.5–5.1)
Sodium: 140 mmol/L (ref 135–145)
Total Bilirubin: 0.7 mg/dL (ref 0.0–1.2)
Total Protein: 5.4 g/dL — ABNORMAL LOW (ref 6.5–8.1)

## 2024-06-07 LAB — GLUCOSE, CAPILLARY: Glucose-Capillary: 86 mg/dL (ref 70–99)

## 2024-06-07 SURGERY — EGD (ESOPHAGOGASTRODUODENOSCOPY)
Anesthesia: General

## 2024-06-07 MED ORDER — IRON SUCROSE 200 MG IVPB - SIMPLE MED
200.0000 mg | Status: DC
  Administered 2024-06-07: 200 mg via INTRAVENOUS
  Filled 2024-06-07: qty 200

## 2024-06-07 MED ORDER — FERROUS SULFATE 325 (65 FE) MG PO TBEC: 325.0000 mg | DELAYED_RELEASE_TABLET | Freq: Every day | ORAL | 3 refills | Status: AC

## 2024-06-07 MED ORDER — POLYETHYLENE GLYCOL 3350 17 GM/SCOOP PO POWD: 17.0000 g | Freq: Every day | ORAL | 0 refills | Status: AC

## 2024-06-07 MED ORDER — VITAMIN B-12 1000 MCG PO TABS
1000.0000 ug | ORAL_TABLET | Freq: Every day | ORAL | Status: DC
  Administered 2024-06-07: 1000 ug via ORAL
  Filled 2024-06-07 (×2): qty 1

## 2024-06-07 MED ORDER — PANTOPRAZOLE SODIUM 40 MG PO TBEC: 40.0000 mg | DELAYED_RELEASE_TABLET | Freq: Two times a day (BID) | ORAL | 1 refills | Status: AC

## 2024-06-07 MED ORDER — POLYETHYLENE GLYCOL 3350 17 GM/SCOOP PO POWD: 119.0000 g | Freq: Once | ORAL | 0 refills | Status: DC

## 2024-06-07 MED ORDER — LIDOCAINE HCL (CARDIAC) PF 100 MG/5ML IV SOSY
PREFILLED_SYRINGE | INTRAVENOUS | Status: DC | PRN
  Administered 2024-06-07: 100 mg via INTRAVENOUS

## 2024-06-07 MED ORDER — SODIUM CHLORIDE 0.9 % IV SOLN: INTRAVENOUS | Status: DC

## 2024-06-07 MED ORDER — B-12 500 MCG PO TABS: 500.0000 ug | ORAL_TABLET | Freq: Every day | ORAL | 0 refills | Status: AC

## 2024-06-07 MED ORDER — PROPOFOL 10 MG/ML IV BOLUS: INTRAVENOUS | Status: DC | PRN

## 2024-06-07 MED ORDER — PHENYLEPHRINE 80 MCG/ML (10ML) SYRINGE FOR IV PUSH (FOR BLOOD PRESSURE SUPPORT): PREFILLED_SYRINGE | INTRAVENOUS | Status: DC | PRN

## 2024-06-07 MED ORDER — POTASSIUM CHLORIDE CRYS ER 20 MEQ PO TBCR
40.0000 meq | EXTENDED_RELEASE_TABLET | Freq: Once | ORAL | Status: DC
  Filled 2024-06-07: qty 2

## 2024-06-07 NOTE — Anesthesia Preprocedure Evaluation (Addendum)
 Anesthesia Evaluation  Patient identified by MRN, date of birth, ID band Patient awake    Reviewed: Allergy & Precautions, H&P , NPO status , Patient's Chart, lab work & pertinent test results  Airway Mallampati: II  TM Distance: >3 FB Neck ROM: full    Dental no notable dental hx.    Pulmonary former smoker   Pulmonary exam normal        Cardiovascular negative cardio ROS Normal cardiovascular exam     Neuro/Psych negative neurological ROS  negative psych ROS   GI/Hepatic negative GI ROS, Neg liver ROS,,,  Endo/Other  negative endocrine ROS    Renal/GU negative Renal ROS  negative genitourinary   Musculoskeletal   Abdominal Normal abdominal exam  (+)   Peds  Hematology  (+) Blood dyscrasia, anemia   Anesthesia Other Findings Symptomatic anemia - Status post 1 unit PRBC on 6/8 - 2 units PRBCs this admission   Past Medical History: No date: Anemia No date: Gastritis No date: Left shoulder tendonitis No date: Right shoulder tendonitis   BMI    Body Mass Index: 23.27 kg/m      Reproductive/Obstetrics negative OB ROS                             Anesthesia Physical Anesthesia Plan  ASA: 3  Anesthesia Plan: General   Post-op Pain Management: Minimal or no pain anticipated   Induction: Intravenous  PONV Risk Score and Plan: Propofol infusion and TIVA  Airway Management Planned: Natural Airway  Additional Equipment:   Intra-op Plan:   Post-operative Plan:   Informed Consent: I have reviewed the patients History and Physical, chart, labs and discussed the procedure including the risks, benefits and alternatives for the proposed anesthesia with the patient or authorized representative who has indicated his/her understanding and acceptance.     Dental Advisory Given  Plan Discussed with: CRNA and Surgeon  Anesthesia Plan Comments:         Anesthesia Quick  Evaluation

## 2024-06-07 NOTE — Transfer of Care (Signed)
 Immediate Anesthesia Transfer of Care Note  Patient: Kathleen Spencer  Procedure(s) Performed: EGD (ESOPHAGOGASTRODUODENOSCOPY) COLONOSCOPY  Patient Location: PACU  Anesthesia Type:General  Level of Consciousness: awake, alert , and oriented  Airway & Oxygen Therapy: Patient Spontanous Breathing  Post-op Assessment: Report given to RN, Post -op Vital signs reviewed and stable, and Patient moving all extremities  Post vital signs: Reviewed and stable  Last Vitals:  Vitals Value Taken Time  BP 104/70 06/07/24 11:05  Temp 36.3 C 06/07/24 11:05  Pulse    Resp    SpO2 100 % 06/07/24 11:05    Last Pain:  Vitals:   06/07/24 1105  TempSrc:   PainSc: 0-No pain         Complications: No notable events documented.

## 2024-06-07 NOTE — Discharge Summary (Addendum)
 Physician Discharge Summary   Patient: Kathleen Spencer MRN: [id number] DOB: [birth year]  Admit date:     06/05/2024  Discharge date: 06/07/24  Discharge Physician: Roise Cleaver   PCP: Patient, No Pcp Per   Recommendations at discharge:   Follow-up with primary care physician for anemia monitoring Follow-up with gastroenterology in 3 months for repeat EGD.  Discharge Diagnoses: Principal Problem:   Symptomatic anemia Active Problems:   Gastritis   Chest pain   Acute blood loss anemia   Iron deficiency anemia  Resolved Problems:   * No resolved hospital problems. *  Hospital Course: Kathleen Spencer is a 38 year old female with history of gastritis, anemia, shoulder tendinitis, who presents with shortness of breath, chest pain, epigastric abdominal pain.  She was recently seen on 6/8 at [facility], ED for headache, chest pain, syncope.  During that admission she was found to have anemia with a hemoglobin of 6.0.  She received 1 unit PRBC with started on iron supplementation.  She presents again on this admission and hemoglobin is 6.2, blood pressure 98/75, tachycardic to 110.  CXR was unremarkable.  Gastroenterology was consulted.  Patient underwent EGD and colonoscopy on 6/14.  Per GI: Colonoscopy had poor prep, EGD revealed large clean-based ulcer.  Patient should continue on PPI twice daily twice daily and will need outpatient follow-up with gastroenterology for repeat EGD in 3 months.  Labs also revealed B12 and iron deficiency.  She was started on supplementation. I have discussed all of these findings with her and she endorses understanding.  She is not currently established with a primary care physician but plans to do so this week for closer outpatient follow-up.  Symptomatic anemia - 2 units PRBCs this admission, Venofer x 1 - Hemoglobin >7 now - Follow-up with PCP for hemoglobin trend - EGD: 1 clean-based ulcer, likely secondary to NSAID use.  Have instructed to discontinue  all NSAIDs.  Tylenol  only for pain as needed - Protonix  twice daily x 30 days - Colonoscopy: Poor prep - Anemia panel: Elevated TIBC, low ferritin.  Folate, iron WNL.  B12 deficiency. - Iron and MiraLAX sent to pharmacy.  B12 sent pharmacy.  Chest pain - Noncardiac - Troponin negative with unremarkable trend - Likely secondary to symptomatic anemia as above - Resolved completely prior to discharge      Consultants: Gastroenterology Procedures performed: EGD and colonoscopy Disposition: Home Diet recommendation:  Discharge Diet Orders (From admission, onward)     Start     Ordered   06/07/24 0000  Diet general        06/07/24 1429           Regular diet DISCHARGE MEDICATION: Allergies as of 06/07/2024   No Known Allergies      Medication List     STOP taking these medications    amoxicillin -clavulanate 875-125 MG tablet Commonly known as: AUGMENTIN    ferrous sulfate  325 (65 FE) MG tablet Replaced by: ferrous sulfate  325 (65 FE) MG EC tablet       TAKE these medications    B-12 500 MCG Tabs Take 500 mcg by mouth daily.   ferrous sulfate  325 (65 FE) MG EC tablet Take 1 tablet (325 mg total) by mouth daily with breakfast. Replaces: ferrous sulfate  325 (65 FE) MG tablet   pantoprazole  40 MG tablet Commonly known as: Protonix  Take 1 tablet (40 mg total) by mouth 2 (two) times daily before a meal.   polyethylene glycol powder 17 GM/SCOOP powder Commonly known  as: MiraLax Take 17 g by mouth daily for 90 doses.        Discharge Exam: Filed Weights   06/05/24 2211 06/06/24 0612 06/07/24 1013  Weight: 71.9 kg 61.5 kg 61.5 kg   Constitutional:  Normal appearance. Non toxic-appearing.  HENT: Head Normocephalic and atraumatic.  Mucous membranes are moist.  Eyes:  Extraocular intact. Conjunctivae normal. Pupils are equal, round, and reactive to light.  Cardiovascular: Rate and Rhythm: Normal rate and regular rhythm.  Pulmonary: Non labored, symmetric  rise of chest wall.  Musculoskeletal:  Normal range of motion.  Skin: warm and dry. not jaundiced.  Neurological: No focal deficit present. alert. Oriented. Psychiatric: Mood and Affect congruent.   Condition at discharge: stable  The results of significant diagnostics from this hospitalization (including imaging, microbiology, ancillary and laboratory) are listed below for reference.   Imaging Studies: DG Chest 2 View Result Date: 06/05/2024 CLINICAL DATA:  Chest pain. EXAM: CHEST - 2 VIEW COMPARISON:  None Available. FINDINGS: The heart size and mediastinal contours are within normal limits. Both lungs are clear. The visualized skeletal structures are unremarkable. IMPRESSION: No active cardiopulmonary disease. Electronically Signed   By: Rosalene Colon M.D.   On: 06/05/2024 17:55   CT Head Wo Contrast Result Date: 06/01/2024 CLINICAL DATA:  Increasing headaches EXAM: CT HEAD WITHOUT CONTRAST TECHNIQUE: Contiguous axial images were obtained from the base of the skull through the vertex without intravenous contrast. RADIATION DOSE REDUCTION: This exam was performed according to the departmental dose-optimization program which includes automated exposure control, adjustment of the mA and/or kV according to patient size and/or use of iterative reconstruction technique. COMPARISON:  12/24/2012 FINDINGS: Brain: No evidence of acute infarction, hemorrhage, hydrocephalus, extra-axial collection or mass lesion/mass effect. Vascular: No hyperdense vessel or unexpected calcification. Skull: Normal. Negative for fracture or focal lesion. Sinuses/Orbits: No acute finding. Other: None. IMPRESSION: No acute intracranial abnormality noted. Electronically Signed   By: Violeta Grey M.D.   On: 06/01/2024 23:46    Microbiology: No results found for this or any previous visit.  Labs: CBC: Recent Labs  Lab 06/01/24 2329 06/05/24 1713 06/06/24 0354 06/06/24 0814 06/06/24 1752 06/06/24 2359 06/07/24 0257   WBC 7.6   < > 8.5 8.3 7.3 6.6 5.9  NEUTROABS 4.9  --   --   --   --   --   --   HGB 6.0*   < > 6.6* 6.9* 8.8* 7.7* 7.7*  HCT 19.1*   < > 20.3* 21.8* 27.5* 23.7* 23.7*  MCV 90.5   < > 87.9 88.6 89.0 88.4 88.1  PLT 409*   < > 270 283 297 255 248   < > = values in this interval not displayed.   Basic Metabolic Panel: Recent Labs  Lab 06/01/24 2329 06/05/24 1713 06/07/24 0257  NA 136 136 140  K 3.6 3.5 3.4*  CL 108 105 109  CO2 20* 23 24  GLUCOSE 95 110* 92  BUN 12 15 7   CREATININE 0.67 0.67 0.66  CALCIUM 8.8* 8.6* 8.2*   Liver Function Tests: Recent Labs  Lab 06/05/24 1731 06/07/24 0257  AST 15 16  ALT 13 16  ALKPHOS 28* 23*  BILITOT 0.5 0.7  PROT 6.4* 5.4*  ALBUMIN 3.4* 3.0*   CBG: Recent Labs  Lab 06/06/24 0724 06/06/24 1126 06/07/24 0912  GLUCAP 93 119* 86    Discharge time spent: 32 minutes.  Signed: Nashiya Disbrow, DO [facility] 06/07/2024

## 2024-06-07 NOTE — Anesthesia Postprocedure Evaluation (Signed)
 Anesthesia Post Note  Patient: TERA PELLICANE  Procedure(s) Performed: EGD (ESOPHAGOGASTRODUODENOSCOPY) COLONOSCOPY  Patient location during evaluation: Endoscopy Anesthesia Type: General Level of consciousness: awake and alert Pain management: pain level controlled Vital Signs Assessment: post-procedure vital signs reviewed and stable Respiratory status: spontaneous breathing, nonlabored ventilation and respiratory function stable Cardiovascular status: blood pressure returned to baseline and stable Postop Assessment: no apparent nausea or vomiting Anesthetic complications: no   No notable events documented.   Last Vitals:  Vitals:   06/07/24 1112 06/07/24 1202  BP: 97/67 99/71  Pulse:  76  Resp:  16  Temp:  36.9 C  SpO2:  99%    Last Pain:  Vitals:   06/07/24 1409  TempSrc:   PainSc: 0-No pain                 Baltazar Bonier

## 2024-06-07 NOTE — Plan of Care (Signed)

## 2024-06-08 ENCOUNTER — Encounter: Payer: Self-pay | Admitting: Gastroenterology

## 2024-06-09 LAB — TYPE AND SCREEN
ABO/RH(D): A POS
Antibody Screen: NEGATIVE
Unit division: 0
Unit division: 0
Unit division: 0

## 2024-06-09 LAB — BPAM RBC
Blood Product Expiration Date: 202506232359
Blood Product Expiration Date: 202506232359
Blood Product Expiration Date: 202507142359
ISSUE DATE / TIME: 202506121953
ISSUE DATE / TIME: 202506131343
Unit Type and Rh: 600
Unit Type and Rh: 600
Unit Type and Rh: 6200

## 2024-06-09 LAB — PREPARE RBC (CROSSMATCH)

## 2024-06-10 LAB — SURGICAL PATHOLOGY

## 2024-06-17 NOTE — Op Note (Signed)
 [facility] Gastroenterology Patient Name: Kathleen Spencer Procedure Date: 06/07/2024 10:15 AM MRN: [id number] Account #: [id number] Date of Birth: 02/29/1986 Admit Type: Inpatient Age: 38 Room: [facility] ENDO ROOM 4 Gender: Female Note Status: Finalized Instrument Name: Upper Endoscope [id number] Procedure:             Upper GI endoscopy Indications:           Epigastric abdominal pain, Unexplained iron  deficiency                         anemia Providers:             Corinn Jess Brooklyn MD, MD Referring MD:          No Local Md, MD (Referring MD) Medicines:             General Anesthesia Complications:         No immediate complications. Estimated blood loss: None. Procedure:             Pre-Anesthesia Assessment:                        - Prior to the procedure, a History and Physical was                         performed, and patient medications and allergies were                         reviewed. The patient is competent. The risks and                         benefits of the procedure and the sedation options and                         risks were discussed with the patient. All questions                         were answered and informed consent was obtained.                         Patient identification and proposed procedure were                         verified by the physician, the nurse, the                         anesthesiologist, the anesthetist and the technician                         in the pre-procedure area in the procedure room in the                         endoscopy suite. Mental Status Examination: alert and                         oriented. Airway Examination: normal oropharyngeal                         airway and neck mobility. Respiratory Examination:  clear to auscultation. CV Examination: normal.                         Prophylactic Antibiotics: The patient does not require                         prophylactic antibiotics.  Prior Anticoagulants: The                         patient has taken no anticoagulant or antiplatelet                         agents. ASA Grade Assessment: II - A patient with mild                         systemic disease. After reviewing the risks and                         benefits, the patient was deemed in satisfactory                         condition to undergo the procedure. The anesthesia                         plan was to use general anesthesia. Immediately prior                         to administration of medications, the patient was                         re-assessed for adequacy to receive sedatives. The                         heart rate, respiratory rate, oxygen saturations,                         blood pressure, adequacy of pulmonary ventilation, and                         response to care were monitored throughout the                         procedure. The physical status of the patient was                         re-assessed after the procedure.                        After obtaining informed consent, the endoscope was                         passed under direct vision. Throughout the procedure,                         the patient's blood pressure, pulse, and oxygen                         saturations were monitored continuously. The Endoscope  was introduced through the mouth, and advanced to the                         second part of duodenum. The upper GI endoscopy was                         accomplished without difficulty. The patient tolerated                         the procedure well. Findings:      The duodenal bulb and second portion of the duodenum were normal.       Biopsies were taken with a cold forceps for histology.      One non-bleeding cratered gastric ulcer with a clean ulcer base (Forrest       Class III) was found in the gastric antrum. The lesion was 10 mm in       largest dimension.      A few localized small erosions  with no bleeding and no stigmata of       recent bleeding were found in the gastric antrum.      The gastric body was normal. Biopsies were taken with a cold forceps for       histology. Biopsies were taken with a cold forceps for Helicobacter       pylori testing.      The cardia and gastric fundus were normal on retroflexion.      The gastroesophageal junction and examined esophagus were normal. Impression:            - Normal duodenal bulb and second portion of the                         duodenum. Biopsied.                        - Non-bleeding gastric ulcer with a clean ulcer base                         (Forrest Class III).                        - Erosive gastropathy with no bleeding and no stigmata                         of recent bleeding.                        - Normal gastric body. Biopsied.                        - Normal gastroesophageal junction and esophagus. Recommendation:        - Await pathology results.                        - Use Prilosec (omeprazole ) 40 mg PO BID for 3 months.                        - Repeat upper endoscopy in 3 months to check healing.                        -  Return patient to hospital ward for possible                         discharge same day.                        - Proceed with colonoscopy as scheduled                        See colonoscopy report Procedure Code(s):     --- Professional ---                        [phone], Esophagogastroduodenoscopy, flexible,                         transoral; with biopsy, single or multiple Diagnosis Code(s):     --- Professional ---                        K25.9, Gastric ulcer, unspecified as acute or chronic,                         without hemorrhage or perforation                        K31.89, Other diseases of stomach and duodenum                        D50.9, Iron  deficiency anemia, unspecified                        R10.13, Epigastric pain CPT copyright 2022 American Medical Association. All  rights reserved. The codes documented in this report are preliminary and upon coder review may  be revised to meet current compliance requirements. Dr. Corinn Brooklyn Corinn Jess Brooklyn MD, MD 06/17/2024 12:50:19 PM This report has been signed electronically. Number of Addenda: 0 Note Initiated On: 06/07/2024 10:15 AM Estimated Blood Loss:  Estimated blood loss: none.      [place]

## 2024-06-17 NOTE — Op Note (Signed)
 [facility] Gastroenterology Patient Name: Kathleen Spencer Procedure Date: 06/07/2024 10:15 AM MRN: [id number] Account #: [id number] Date of Birth: 03/15/1986 Admit Type: Inpatient Age: 38 Room: [facility] ENDO ROOM 4 Gender: Female Note Status: Finalized Instrument Name: Colonoscope [id number] Procedure:             Colonoscopy Indications:           This is the patient's first colonoscopy, Unexplained                         iron  deficiency anemia Providers:             Corinn Jess Brooklyn MD, MD Referring MD:          No Local Md, MD (Referring MD) Medicines:             General Anesthesia Complications:         No immediate complications. Estimated blood loss: None. Procedure:             Pre-Anesthesia Assessment:                        - Prior to the procedure, a History and Physical was                         performed, and patient medications and allergies were                         reviewed. The patient is competent. The risks and                         benefits of the procedure and the sedation options and                         risks were discussed with the patient. All questions                         were answered and informed consent was obtained.                         Patient identification and proposed procedure were                         verified by the physician, the nurse, the                         anesthesiologist, the anesthetist and the technician                         in the pre-procedure area in the procedure room in the                         endoscopy suite. Mental Status Examination: alert and                         oriented. Airway Examination: normal oropharyngeal                         airway and neck mobility. Respiratory Examination:  clear to auscultation. CV Examination: normal.                         Prophylactic Antibiotics: The patient does not require                         prophylactic  antibiotics. Prior Anticoagulants: The                         patient has taken no anticoagulant or antiplatelet                         agents. ASA Grade Assessment: II - A patient with mild                         systemic disease. After reviewing the risks and                         benefits, the patient was deemed in satisfactory                         condition to undergo the procedure. The anesthesia                         plan was to use general anesthesia. Immediately prior                         to administration of medications, the patient was                         re-assessed for adequacy to receive sedatives. The                         heart rate, respiratory rate, oxygen saturations,                         blood pressure, adequacy of pulmonary ventilation, and                         response to care were monitored throughout the                         procedure. The physical status of the patient was                         re-assessed after the procedure.                        After obtaining informed consent, the colonoscope was                         passed under direct vision. Throughout the procedure,                         the patient's blood pressure, pulse, and oxygen                         saturations were monitored continuously. The  Colonoscope was introduced through the anus and                         advanced to the 10 cm into the ileum. The colonoscopy                         was performed without difficulty. The patient                         tolerated the procedure well. The quality of the bowel                         preparation was poor. The terminal ileum, ileocecal                         valve, appendiceal orifice, and rectum were                         photographed. Findings:      The perianal and digital rectal examinations were normal. Pertinent       negatives include normal sphincter tone and no palpable  rectal lesions.      The terminal ileum appeared normal.      Copious quantities of liquid stool was found in the entire colon,       precluding visualization.      The retroflexed view of the distal rectum and anal verge was normal and       showed no anal or rectal abnormalities. Impression:            - Preparation of the colon was poor.                        - The examined portion of the ileum was normal.                        - Stool in the entire examined colon.                        - The distal rectum and anal verge are normal on                         retroflexion view.                        - No specimens collected. Recommendation:        - Return patient to hospital ward for possible                         discharge same day.                        - Resume previous diet today.                        - Continue present medications. Procedure Code(s):     --- Professional ---                        [id number], Colonoscopy, flexible; diagnostic, including  collection of specimen(s) by brushing or washing, when                         performed (separate procedure) Diagnosis Code(s):     --- Professional ---                        D50.9, Iron  deficiency anemia, unspecified CPT copyright 2022 American Medical Association. All rights reserved. The codes documented in this report are preliminary and upon coder review may  be revised to meet current compliance requirements. Dr. Corinn Brooklyn Corinn Jess Brooklyn MD, MD 06/17/2024 12:49:50 PM This report has been signed electronically. Number of Addenda: 0 Note Initiated On: 06/07/2024 10:15 AM Scope Withdrawal Time: 0 hours 4 minutes 24 seconds  Total Procedure Duration: 0 hours 10 minutes 5 seconds  Estimated Blood Loss:  Estimated blood loss: none.      [facility]
